# Patient Record
Sex: Male | Born: 1995 | Race: White | Hispanic: No | Marital: Single | Smoking: Never smoker
Health system: Southern US, Community
[De-identification: ages and names within clinical notes are randomized; demographics above are authoritative.]

## PROBLEM LIST (undated history)

## (undated) HISTORY — PX: TONSILLECTOMY: SUR1361

## (undated) HISTORY — PX: TONSILLECTOMY AND ADENOIDECTOMY: SUR1326

## (undated) HISTORY — PX: TYMPANOSTOMY TUBE PLACEMENT: SHX32

---

## 2019-12-22 ENCOUNTER — Encounter: Payer: Self-pay | Admitting: Emergency Medicine

## 2019-12-22 ENCOUNTER — Emergency Department (INDEPENDENT_AMBULATORY_CARE_PROVIDER_SITE_OTHER): Payer: 59

## 2019-12-22 ENCOUNTER — Other Ambulatory Visit: Payer: Self-pay

## 2019-12-22 ENCOUNTER — Emergency Department
Admission: EM | Admit: 2019-12-22 | Discharge: 2019-12-22 | Disposition: A | Discharge status: Home / Self Care | Payer: 59 | Source: Home / Self Care

## 2019-12-22 DIAGNOSIS — M545 Low back pain, unspecified: Secondary | ICD-10-CM

## 2019-12-22 DIAGNOSIS — Y9355 Activity, bike riding: Secondary | ICD-10-CM

## 2019-12-22 DIAGNOSIS — M4310 Spondylolisthesis, site unspecified: Secondary | ICD-10-CM

## 2019-12-22 MED ORDER — PREDNISONE 50 MG PO TABS: 50.0000 mg | ORAL_TABLET | Freq: Every day | ORAL | 0 refills | Status: AC

## 2019-12-22 MED ORDER — CYCLOBENZAPRINE HCL 5 MG PO TABS: 5.0000 mg | ORAL_TABLET | Freq: Two times a day (BID) | ORAL | 0 refills | Status: DC | PRN

## 2019-12-22 NOTE — ED Provider Notes (Signed)
[facility]    CSN: [id number] Arrival date & time: 12/22/19  1045      History   Chief Complaint Chief Complaint  Patient presents with  . Back Pain    low    HPI Nicholas Ware is a 24 y.o. male.   HPI  Nicholas Ware is a 24 y.o. male presenting to UC with c/o gradually worsen bilateral lower back pain, worse along his lower spine.  He woke up with mild pain yesterday but it worsened after going for a 3 hour bike ride.  Hx of similar pain 2-3 years ago after a bike ride. He was advised he had a "pinched nerve" and was given muscle relaxers, which did help. Pt denies radiation of pain or numbness in arms or legs.  He took 200mg  Advil last night and 400mg  Advil this morning around 9AM w/o relief.    History reviewed. No pertinent past medical history.  There are no problems to display for this patient.   Past Surgical History:  Procedure Laterality Date  . TONSILLECTOMY    . TONSILLECTOMY AND ADENOIDECTOMY    . TYMPANOSTOMY TUBE PLACEMENT  ilst       Home Medications    Prior to Admission medications   Medication Sig Start Date End Date Taking? Authorizing Provider  cyclobenzaprine (FLEXERIL) 5 MG tablet Take 1-2 tablets (5-10 mg total) by mouth 2 (two) times daily as needed for muscle spasms. 12/22/19   Noe Gens, PA-C  predniSONE (DELTASONE) 50 MG tablet Take 1 tablet (50 mg total) by mouth daily with breakfast for 5 days. 12/22/19 12/27/19  Noe Gens, PA-C    Family History Family History  Problem Relation Age of Onset  . Hypertension Mother   . Diabetes Father     Social History Social History   Tobacco Use  . Smoking status: Never Smoker  . Smokeless tobacco: Never Used  Substance Use Topics  . Alcohol use: Never  . Drug use: Never     Allergies   Cefprozil, Cephalosporins, Loracarbef, and Penicillins   Review of Systems Review of Systems  Genitourinary: Negative for dysuria, flank pain, frequency and hematuria.    Musculoskeletal: Positive for back pain and myalgias. Negative for arthralgias and gait problem.  Skin: Negative for color change and wound.  Neurological: Negative for weakness and numbness.     Physical Exam Triage Vital Signs ED Triage Vitals  Enc Vitals Group     BP 12/22/19 1107 122/87     Pulse Rate 12/22/19 1107 70     Resp 12/22/19 1107 15     Temp 12/22/19 1107 98.7 F (37.1 C)     Temp Source 12/22/19 1107 Oral     SpO2 12/22/19 1107 99 %     Weight 12/22/19 1110 230 lb (104.3 kg)     Height 12/22/19 1110 5\' 7"  (1.702 m)     Head Circumference --      Peak Flow --      Pain Score 12/22/19 1109 3     Pain Loc --      Pain Edu? --      Excl. in [place]? --    No data found.  Updated Vital Signs BP 122/87 (BP Location: Right Arm)   Pulse 70   Temp 98.7 F (37.1 C) (Oral)   Resp 15   Ht 5\' 7"  (1.702 m)   Wt 230 lb (104.3 kg)   SpO2 99%   BMI 36.02 kg/m  Visual Acuity Right Eye Distance:   Left Eye Distance:   Bilateral Distance:    Right Eye Near:   Left Eye Near:    Bilateral Near:     Physical Exam Vitals and nursing note reviewed.  Constitutional:      Appearance: Normal appearance. He is well-developed.  HENT:     Head: Normocephalic and atraumatic.  Cardiovascular:     Rate and Rhythm: Normal rate.  Pulmonary:     Effort: Pulmonary effort is normal.  Musculoskeletal:        General: Tenderness present. Normal range of motion.     Cervical back: Normal range of motion. Bony tenderness present. Normal range of motion.       Back:     Comments: Full ROM upper and lower extremities with 5/5 strength, increased pain with hip flexion and rotation.   Skin:    General: Skin is warm and dry.     Capillary Refill: Capillary refill takes less than 2 seconds.     Findings: No bruising or erythema.  Neurological:     Mental Status: He is alert and oriented to person, place, and time.  Psychiatric:        Behavior: Behavior normal.      UC  Treatments / Results  Labs (all labs ordered are listed, but only abnormal results are displayed) Labs Reviewed - No data to display  EKG   Radiology DG Lumbar Spine Complete  Result Date: 12/22/2019 CLINICAL DATA:  Low back pain. Pain after bike riding. EXAM: LUMBAR SPINE - COMPLETE 4+ VIEW COMPARISON:  None. FINDINGS: No sign of fracture. 5 mm anterolisthesis of L5 on S1 w with some degenerative changes in this area. Vertebral body heights and disc spaces are otherwise well maintained. IMPRESSION: 5 mm anterolisthesis of L5 on S1 with some associated degenerative changes. Electronically Signed   By: Donzetta Kohut M.D.   On: 12/22/2019 11:59    Procedures Procedures (including critical care time)  Medications Ordered in UC Medications - No data to display  Initial Impression / Assessment and Plan / UC Course  I have reviewed the triage vital signs and the nursing notes.  Pertinent labs & imaging results that were available during my care of the patient were reviewed by me and considered in my medical decision making (see chart for details).    No red flag symptoms  Discussed imaging with pt Will tx symptomatically for pain Encouraged f/u with Sports Medicine for further evaluation and treatment for recurrent low back pain. AVS provided.  Final Clinical Impressions(s) / UC Diagnoses   Final diagnoses:  Acute midline low back pain without sciatica  Anterolisthesis     Discharge Instructions      Flexeril (cyclobenzaprine) is a muscle relaxer and may cause drowsiness. Do not drink alcohol, drive, or operate heavy machinery while taking.  You have been prescribed 5 days of prednisone, an oral steroid to help with inflammation in your muscles and around your lower spine to help with pain.  Be sure to take with food.  You may take 500mg  acetaminophen every 4-6 hours or in combination with ibuprofen 400-600mg  every 6-8 hours as needed for pain and inflammation.  There is an  increased risk of stomach ulcers and bleeds when taking prednisone with NSAIDs, do not take more than advised on product packaging or prescription. Take medication with food.  Call to schedule a follow up with Sports Medicine or an Orthopedist for further evaluation and treatment of  recurrent low back pain.  Additional imaging and/or a referral to physical therapy may be beneficial.       ED Prescriptions    Medication Sig Dispense Auth. Provider   cyclobenzaprine (FLEXERIL) 5 MG tablet Take 1-2 tablets (5-10 mg total) by mouth 2 (two) times daily as needed for muscle spasms. 30 tablet Doroteo Glassman, Stokely Jeancharles O, PA-C   predniSONE (DELTASONE) 50 MG tablet Take 1 tablet (50 mg total) by mouth daily with breakfast for 5 days. 5 tablet Lurene Shadow, [place]     I have reviewed the PDMP during this encounter.   Lurene Shadow, PA-C 12/22/19 1210

## 2019-12-22 NOTE — Discharge Instructions (Signed)
  Flexeril (cyclobenzaprine) is a muscle relaxer and may cause drowsiness. Do not drink alcohol, drive, or operate heavy machinery while taking.  You have been prescribed 5 days of prednisone, an oral steroid to help with inflammation in your muscles and around your lower spine to help with pain.  Be sure to take with food.  You may take 500mg  acetaminophen every 4-6 hours or in combination with ibuprofen 400-600mg  every 6-8 hours as needed for pain and inflammation.  There is an increased risk of stomach ulcers and bleeds when taking prednisone with NSAIDs, do not take more than advised on product packaging or prescription. Take medication with food.  Call to schedule a follow up with Sports Medicine or an Orthopedist for further evaluation and treatment of recurrent low back pain.  Additional imaging and/or a referral to physical therapy may be beneficial.

## 2019-12-22 NOTE — ED Triage Notes (Signed)
Low back pain since yesterday - increased pain after going for a bike ride for 3 hours Sam pain in past "was told he had a pinched nerve and was given muscle relaxers" Pain increases w/ movement OTC advill 200 mg last night & advil 400mg  at 0900 min relief

## 2019-12-25 ENCOUNTER — Ambulatory Visit (INDEPENDENT_AMBULATORY_CARE_PROVIDER_SITE_OTHER): Payer: 59 | Admitting: Sports Medicine

## 2019-12-25 ENCOUNTER — Encounter: Payer: Self-pay | Admitting: Sports Medicine

## 2019-12-25 DIAGNOSIS — M4317 Spondylolisthesis, lumbosacral region: Secondary | ICD-10-CM | POA: Diagnosis not present

## 2019-12-25 NOTE — Progress Notes (Signed)
    Procedures performed today:    None.  Independent interpretation of notes and tests performed by another provider:   I did personally review his lumbar spine x-rays which showed very mild degenerative disc disease at the L5-S1 level as well as mild grade 1 spondylolisthesis at the same level, there is potentially a pars interarticularis defect at the L5-S1 level as well.  Brief History, Exam, Impression, and Recommendations:    Spondylolisthesis at L5-S1 level This is a very pleasant 24 year old male, he went on a long bike ride, subsequently had severe back pain, was seen in urgent care x-rays showed mild DDD at the L5-S1 level as well as grade 1 spondylolisthesis. He was treated appropriately with prednisone, Flexeril, and has had good improvement after just 2 to 3 days. We discussed the evolutionary anthropology of low back pain as well as the treatment plan, he will finish the prednisone, and then can do Motrin or Tylenol as desired. Adding aggressive formal physical therapy, return to see me in 6 weeks, MRI for interventional planning if no better. No red flag symptoms.    ___________________________________________ Ihor Austin. Benjamin Stain, M.D., ABFM., CAQSM. Primary Care and Sports Medicine [facility]  Adjunct Instructor of Family Medicine  [employer]

## 2019-12-25 NOTE — Assessment & Plan Note (Signed)
This is a very pleasant 24 year old male, he went on a long bike ride, subsequently had severe back pain, was seen in urgent care x-rays showed mild DDD at the L5-S1 level as well as grade 1 spondylolisthesis. He was treated appropriately with prednisone, Flexeril, and has had good improvement after just 2 to 3 days. We discussed the evolutionary anthropology of low back pain as well as the treatment plan, he will finish the prednisone, and then can do Motrin or Tylenol as desired. Adding aggressive formal physical therapy, return to see me in 6 weeks, MRI for interventional planning if no better. No red flag symptoms.

## 2019-12-26 ENCOUNTER — Ambulatory Visit (INDEPENDENT_AMBULATORY_CARE_PROVIDER_SITE_OTHER): Payer: 59 | Admitting: Physical Therapy

## 2019-12-26 ENCOUNTER — Encounter: Payer: Self-pay | Admitting: Physical Therapy

## 2019-12-26 ENCOUNTER — Other Ambulatory Visit: Payer: Self-pay

## 2019-12-26 DIAGNOSIS — M6281 Muscle weakness (generalized): Secondary | ICD-10-CM | POA: Diagnosis not present

## 2019-12-26 DIAGNOSIS — M545 Low back pain, unspecified: Secondary | ICD-10-CM

## 2019-12-26 NOTE — Therapy (Signed)
[facility] [place] [address] [place], [place], [address] Phone: [phone]   Fax:  [phone]  Physical Therapy Evaluation  Patient Details  Name: Nicholas Ware MRN: [id number] Date of Birth: 12/31/1995 Referring Provider (PT): Monica Becton, MD   Encounter Date: 12/26/2019  PT End of Session - 12/26/19 1101    Visit Number  1    Number of Visits  12    Date for PT Re-Evaluation  02/06/20    PT Start Time  1105    PT Stop Time  1155    PT Time Calculation (min)  50 min    Activity Tolerance  Patient tolerated treatment well    Behavior During Therapy  [facility] for tasks assessed/performed       History reviewed. No pertinent past medical history.  Past Surgical History:  Procedure Laterality Date  . TONSILLECTOMY    . TONSILLECTOMY AND ADENOIDECTOMY    . TYMPANOSTOMY TUBE PLACEMENT  ilst    There were no vitals filed for this visit.   Subjective Assessment - 12/26/19 1105    Subjective  Pt reports that on Saturday afternoon he had a 2 hour long bike ride and had immediate low back pain thereafter. On Sunday his low back still hurt despite pain medication and went to see urgent care. Pt notes that It is currently not anywhere close to how it felt on Sunday. He reports increased pain with twisting and while sitting still in the straight back chair there is very dull pain. He states pain is 3 or 4 out of 10 at rest. 6 to 7 with activity. He states he has been sleeping in recliner    Limitations  Other (comment);Walking    Currently in Pain?  Yes    Pain Score  6     Pain Location  Back    Pain Orientation  Lower;Right;Left    Pain Descriptors / Indicators  Dull;Sore    Pain Type  Acute pain    Pain Radiating Towards  Reports radiation as "wings"    Pain Onset  In the past 7 days    Pain Frequency  Intermittent    Aggravating Factors   Twisting and sidelying         [facility] PT Assessment - 12/26/19 0001      Assessment   Medical Diagnosis  M43.17 (ICD-10-CM) - Spondylolisthesis at L5-S1 level    Referring Provider (PT)  Monica Becton, MD    Onset Date/Surgical Date  12/21/19      Precautions   Precautions  --      Balance Screen   Has the patient fallen in the past 6 months  No    Has the patient had a decrease in activity level because of a fear of falling?   No    Is the patient reluctant to leave their home because of a fear of falling?   No      Home Environment   Living Environment  Private residence    Living Arrangements  Alone    Type of Home  Apartment      Prior Function   Level of Independence  Independent    Vocation  Full time employment   Engineer, mostly at desk   Leisure  Rides bike      Squat   Comments  Increased knee flexion past toes; pain with end range squat, heels off ground      Posture/Postural Control  Posture Comments  Increased lumbar lordosis, flat thoracic      AROM   Lumbar Flexion  Limited 80%    Lumbar Extension  Limited ~25%    Lumbar - Right Side Bend  WFL    Lumbar - Left Side Bend  WFL     Lumbar - Right Rotation  [facility] but painful    Lumbar - Left Rotation  WFL but painful    Thoracic - Right Rotation  ~50% limited    Thoracic - Left Rotation  [facility]      Strength   Right Hip Extension  4/5   limited due to pain   Right Hip ABduction  4/5    Right Hip ADduction  --   limited due to pain   Left Hip Extension  4/5   limited due to pain   Left Hip ABduction  4/5   limited due to pain     Flexibility   Hamstrings  45 degrees bilaterally    Quadriceps  45 degrees bilaterally Ely's    Piriformis  Attempted with figure 4, limited due to back pain      Palpation   Spinal mobility  Tenderness and hypomobility of thoracolumbar    Palpation comment  Quadratus lumborum mildly tender, psoas tender      FABER test   findings  Negative      Prone Knee Bend Test   Findings  Positive      Straight Leg Raise   Findings  Positive     Comment  Increased R vs L pain                  Objective measurements completed on examination: See above findings.      [place] Adult PT Treatment/Exercise - 12/26/19 0001      Lumbar Exercises: Stretches   Active Hamstring Stretch  3 reps;30 seconds      Lumbar Exercises: Supine   Pelvic Tilt  5 reps;5 seconds    Basic Lumbar Stabilization  5 reps;5 seconds   TSA contraction with pelvic tilt            PT Education - 12/26/19 1213    Education Details  Discussed core activation and strengthening; discussed stretching as needed and improving posture for tasks. Discussed attempting to sleep in bed with modifications. Discussed spine care/protection    Person(s) Educated  Patient    Methods  Explanation;Demonstration;Handout;Verbal cues;Tactile cues    Comprehension  Verbalized understanding;Returned demonstration;Verbal cues required       PT Short Term Goals - 12/26/19 1228      PT SHORT TERM GOAL #1   Title  Pt will be independent with HEP    Time  3    Period  Weeks    Status  New    Target Date  01/16/20        PT Long Term Goals - 12/26/19 1228      PT LONG TERM GOAL #1   Title  Pt will demonstrate improved thoracolumbar mobility in flexion and extension for daily tasks    Time  6    Period  Weeks    Status  New    Target Date  02/06/20      PT LONG TERM GOAL #2   Title  Pt will have 5/5 bilateral hip strength for improved pain    Baseline  4/5 in abduction and extension; limited due to pain    Time  6  Period  Weeks    Status  New    Target Date  02/06/20      PT LONG TERM GOAL #3   Title  Pt will be able to tolerate riding bike >1 hour without additional back pain    Baseline  Unable    Time  6    Period  Weeks    Status  New    Target Date  02/06/20      PT LONG TERM GOAL #4   Title  Pt will report decrease in back pain to <2/10    Baseline  6/10 at worst    Time  6    Period  Weeks    Status  New    Target Date   02/06/20      PT LONG TERM GOAL #5   Title  Pt will have reduced FOTO score to 25% impairment    Baseline  FOTO is 53%    Time  6    Period  Weeks    Status  New    Target Date  02/06/20             Plan - 12/26/19 1216    Clinical Impression Statement  Pt is a 24 year old M presenting to [facility] with c/o bilateral back pain exacerbated since long bike ride 5 days ago (12/21/19). Pt presents with pain, increased lumbar lordosis, bilateral hip and core weakness, and thoracolumbar hypomobility affecting pt's ability to sleep in bed, daily tasks at home and at work, and limiting biking activity. Pt would benefit from therapy to optimize his level of function. Treatment focused primarily on stretching, activation of transversus abdominus and spine care education.    Examination-Activity Limitations  Bend;Stairs;Sleep;Stand    Examination-Participation Restrictions  Community Activity;Other    Clinical Decision Making  Low    Rehab Potential  Good    PT Frequency  2x / week    PT Duration  6 weeks    PT Treatment/Interventions  Electrical Stimulation;Cryotherapy;Iontophoresis 4mg /ml Dexamethasone;Moist Heat;Ultrasound;Stair training;Functional mobility training;Therapeutic activities;Therapeutic exercise;Neuromuscular re-education;Manual techniques;Patient/family education;ADLs/Self Care Home Management;Passive range of motion;Dry needling;Taping;Spinal Manipulations;Joint Manipulations    PT Next Visit Plan  Review HEP. Continue and progress core strengthening. Add hip strengthening. Reinforce posture. Perform stretching, STM and joint mobilizations if indicate. Consider heat and e-stim. May benefit from TENs unit at home.    PT Home Exercise Plan  [email or web address]    Consulted and Agree with Plan of Care  Patient       Patient will benefit from skilled therapeutic intervention in order to improve the following deficits and impairments:  Decreased range of motion, Increased fascial restricitons,  Decreased activity tolerance, Hypermobility, Pain, Hypomobility, Impaired flexibility, Improper body mechanics, Decreased strength, Postural dysfunction  Visit Diagnosis: Acute bilateral low back pain without sciatica  Muscle weakness (generalized)     Problem List Patient Active Problem List   Diagnosis Date Noted  . Spondylolisthesis at L5-S1 level 12/25/2019    [facility] April Ma L Shaelin Lalley PT, DPT 12/26/2019, 12:44 PM  [facility] [address] [place], [place], [place] Phone: [phone]   Fax:  [phone]  Name: Nicholas Ware MRN: Dolores Hoose Date of Birth: 02-19-1996

## 2019-12-26 NOTE — Patient Instructions (Signed)
Access Code: [email or web address] URL: [email or web address] Date: 12/26/2019 Prepared by: Vernon Prey April Kirstie Peri  Exercises Supine Hamstring Stretch with Strap - 1 x daily - 7 x weekly - 10 reps - 3 sets Supine Posterior Pelvic Tilt - 1 x daily - 7 x weekly - 10 reps - 3 sets Supine Transversus Abdominis Bracing - Hands on Stomach - 1 x daily - 7 x weekly - 10 reps - 3 sets  Patient Education Hospital doctor

## 2020-01-01 ENCOUNTER — Encounter: Payer: Self-pay | Admitting: Physical Therapy

## 2020-01-01 ENCOUNTER — Other Ambulatory Visit: Payer: Self-pay

## 2020-01-01 ENCOUNTER — Ambulatory Visit (INDEPENDENT_AMBULATORY_CARE_PROVIDER_SITE_OTHER): Payer: 59 | Admitting: Physical Therapy

## 2020-01-01 DIAGNOSIS — M545 Low back pain, unspecified: Secondary | ICD-10-CM

## 2020-01-01 DIAGNOSIS — M6281 Muscle weakness (generalized): Secondary | ICD-10-CM | POA: Diagnosis not present

## 2020-01-01 NOTE — Patient Instructions (Signed)

## 2020-01-01 NOTE — Therapy (Signed)
[facility] [place] [address] [place], [place], [address] Phone: [phone]   Fax:  [phone]  Physical Therapy Treatment  Patient Details  Name: Nicholas Ware MRN: [id number] Date of Birth: 12/24/1995 Referring Provider (PT): Monica Becton, MD   Encounter Date: 01/01/2020  PT End of Session - 01/01/20 1105    Visit Number  2    Number of Visits  12    Date for PT Re-Evaluation  02/06/20    PT Start Time  1105    PT Stop Time  1154    PT Time Calculation (min)  49 min    Activity Tolerance  Patient tolerated treatment well    Behavior During Therapy  [facility] for tasks assessed/performed       History reviewed. No pertinent past medical history.  Past Surgical History:  Procedure Laterality Date  . TONSILLECTOMY    . TONSILLECTOMY AND ADENOIDECTOMY    . TYMPANOSTOMY TUBE PLACEMENT  ilst    There were no vitals filed for this visit.  Subjective Assessment - 01/01/20 1109    Subjective  Pt reports he is no longer sleeping in recliner; sleeping with pillow under abdomen in prone. Has been performing HEP daily.    Currently in Pain?  Yes    Pain Score  2    up to 4/10   Pain Location  Back    Pain Orientation  Right;Left;Lower    Pain Descriptors / Indicators  Sore;Dull    Aggravating Factors   up and moving, twisting    Pain Relieving Factors  sitting still         [facility] PT Assessment - 01/01/20 0001      Assessment   Medical Diagnosis  M43.17 (ICD-10-CM) - Spondylolisthesis at L5-S1 level    Referring Provider (PT)  Monica Becton, MD    Onset Date/Surgical Date  12/21/19    Next MD Visit  02/05/20       [facility] Adult PT Treatment/Exercise - 01/01/20 0001      Self-Care   Self-Care  Other Self-Care Comments    Other Self-Care Comments   Pt educated re: self massage with roller stick to LE and ball to back and hip musculature. Pt returned demo with cues.       Lumbar Exercises: Stretches   Passive Hamstring Stretch  Left;Right;3 reps;20 seconds    Passive Hamstring Stretch Limitations  supine with strap, then seated x 1 rep    Hip Flexor Stretch  Right;Left;1 rep;20 seconds    Hip Flexor Stretch Limitations  trial of arm overhead     Piriformis Stretch  Right;Left;20 seconds;1 rep   seated      Lumbar Exercises: Aerobic   Recumbent Bike  L7 (pt preference): 4.5 min for warm up.       Lumbar Exercises: Supine   Ab Set  5 reps;5 seconds    Clam  5 reps;5 seconds    Bent Knee Raise  5 reps    Bent Knee Raise Limitations  Rt hip cramped after 4 reps    Bridge  10 reps      Modalities   Modalities  Moist Heat;Electrical Stimulation      Moist Heat Therapy   Number Minutes Moist Heat  10 Minutes    Moist Heat Location  Lumbar Spine      Electrical Stimulation   Electrical Stimulation Location  bilat lumbar     Electrical Stimulation Action  IFC  Electrical Stimulation Parameters  intensity to tolerance x 10     Electrical Stimulation Goals  Pain;Tone             PT Education - 01/01/20 2048    Education Details  TENS info, HEP- added hip flexor stretch    Person(s) Educated  Patient    Methods  Explanation    Comprehension  Verbalized understanding;Returned demonstration       PT Short Term Goals - 12/26/19 1228      PT SHORT TERM GOAL #1   Title  Pt will be independent with HEP    Time  3    Period  Weeks    Status  New    Target Date  01/16/20        PT Long Term Goals - 12/26/19 1228      PT LONG TERM GOAL #1   Title  Pt will demonstrate improved thoracolumbar mobility in flexion and extension for daily tasks    Time  6    Period  Weeks    Status  New    Target Date  02/06/20      PT LONG TERM GOAL #2   Title  Pt will have 5/5 bilateral hip strength for improved pain    Baseline  4/5 in abduction and extension; limited due to pain    Time  6    Period  Weeks    Status  New    Target Date  02/06/20      PT LONG TERM GOAL #3    Title  Pt will be able to tolerate riding bike >1 hour without additional back pain    Baseline  Unable    Time  6    Period  Weeks    Status  New    Target Date  02/06/20      PT LONG TERM GOAL #4   Title  Pt will report decrease in back pain to <2/10    Baseline  6/10 at worst    Time  6    Period  Weeks    Status  New    Target Date  02/06/20      PT LONG TERM GOAL #5   Title  Pt will have reduced FOTO score to 25% impairment    Baseline  FOTO is 53%    Time  6    Period  Weeks    Status  New    Target Date  02/06/20            Plan - 01/01/20 2043    Clinical Impression Statement  Pt reporting overall pain reduction in intensity and frequency. Pt able to engage TA with minimal cues.  Pt tolerated all exercises well without increase in pain.  Pt reported reduction of pain and improvement in lumbar ROM after estim/MHP at end of session.    Examination-Activity Limitations  Bend;Stairs;Sleep;Stand    Examination-Participation Restrictions  Community Activity;Other    Rehab Potential  Good    PT Frequency  2x / week    PT Duration  6 weeks    PT Treatment/Interventions  Electrical Stimulation;Cryotherapy;Iontophoresis 4mg /ml Dexamethasone;Moist Heat;Ultrasound;Stair training;Functional mobility training;Therapeutic activities;Therapeutic exercise;Neuromuscular re-education;Manual techniques;Patient/family education;ADLs/Self Care Home Management;Passive range of motion;Dry needling;Taping;Spinal Manipulations;Joint Manipulations    PT Next Visit Plan  Continue and progress core strengthening.    PT Home Exercise Plan  [id number]    Consulted and Agree with Plan of Care  Patient  Patient will benefit from skilled therapeutic intervention in order to improve the following deficits and impairments:  Decreased range of motion, Increased fascial restricitons, Decreased activity tolerance, Hypermobility, Pain, Hypomobility, Impaired flexibility, Improper body mechanics,  Decreased strength, Postural dysfunction  Visit Diagnosis: Acute bilateral low back pain without sciatica  Muscle weakness (generalized)     Problem List Patient Active Problem List   Diagnosis Date Noted  . Spondylolisthesis at L5-S1 level 12/25/2019   Mayer Camel, PTA 01/01/20 8:50 PM  [facility] [address] [place], [place], [address] Phone: [phone]   Fax:  [phone]  Name: Nicholas Ware MRN: [id number] Date of Birth: [birth year]

## 2020-01-03 ENCOUNTER — Other Ambulatory Visit: Payer: Self-pay

## 2020-01-03 ENCOUNTER — Ambulatory Visit (INDEPENDENT_AMBULATORY_CARE_PROVIDER_SITE_OTHER): Payer: 59 | Admitting: Physical Therapy

## 2020-01-03 ENCOUNTER — Encounter: Payer: Self-pay | Admitting: Family Medicine

## 2020-01-03 ENCOUNTER — Ambulatory Visit (INDEPENDENT_AMBULATORY_CARE_PROVIDER_SITE_OTHER): Payer: 59 | Admitting: Family Medicine

## 2020-01-03 ENCOUNTER — Encounter: Payer: Self-pay | Admitting: Physical Therapy

## 2020-01-03 VITALS — BP 131/76 | HR 61 | Wt 232.0 lb

## 2020-01-03 DIAGNOSIS — Z0000 Encounter for general adult medical examination without abnormal findings: Secondary | ICD-10-CM | POA: Diagnosis not present

## 2020-01-03 DIAGNOSIS — M545 Low back pain, unspecified: Secondary | ICD-10-CM

## 2020-01-03 DIAGNOSIS — E6609 Other obesity due to excess calories: Secondary | ICD-10-CM

## 2020-01-03 DIAGNOSIS — Z13220 Encounter for screening for lipoid disorders: Secondary | ICD-10-CM | POA: Diagnosis not present

## 2020-01-03 DIAGNOSIS — R21 Rash and other nonspecific skin eruption: Secondary | ICD-10-CM | POA: Diagnosis not present

## 2020-01-03 DIAGNOSIS — M6281 Muscle weakness (generalized): Secondary | ICD-10-CM

## 2020-01-03 NOTE — Patient Instructions (Addendum)
Great to meet you today! Please have labs completed.  Schedule an annual exam after you have these completed and we'll review these at that visit.  Try clotrimazole cream to back of neck area.

## 2020-01-03 NOTE — Therapy (Signed)
[place], [place], [address] Phone: [phone]   Fax:  [phone]  Physical Therapy Treatment  Patient Details  Name: Nicholas Ware MRN: [id number] Date of Birth: 1996-07-08 Referring Provider (PT): Silverio Decamp, MD   Encounter Date: 01/03/2020  PT End of Session - 01/03/20 1014    Visit Number  3    Number of Visits  12    Date for PT Re-Evaluation  02/06/20    PT Start Time  0932    PT Stop Time  1056    PT Time Calculation (min)  41 min    Activity Tolerance  Patient tolerated treatment well    Behavior During Therapy  [facility] for tasks assessed/performed       History reviewed. No pertinent past medical history.  Past Surgical History:  Procedure Laterality Date  . TONSILLECTOMY    . TONSILLECTOMY AND ADENOIDECTOMY    . TYMPANOSTOMY TUBE PLACEMENT  ilst    There were no vitals filed for this visit.  Subjective Assessment - 01/03/20 1017    Subjective  Pt states he has had no pain. Pt reports some soreness initially in the morning but then improved throughout the day. Localized area of pain noted in low back, no more radiation    Limitations  Other (comment);Walking    Currently in Pain?  Yes    Pain Score  2     Pain Location  Back    Pain Orientation  Right;Left;Lower    Pain Descriptors / Indicators  Sore;Dull    Pain Type  Acute pain    Pain Onset  In the past 7 days                        [facility] Adult PT Treatment/Exercise - 01/03/20 0001      Lumbar Exercises: Supine   Clam  10 reps    Bridge  10 reps;2 seconds      Lumbar Exercises: Prone   Opposite Arm/Leg Raise  10 reps;Right arm/Left leg;Left arm/Right leg    Opposite Arm/Leg Raise Limitations  To fatigue      Knee/Hip Exercises: Stretches   Active Hamstring Stretch  Both;30 seconds;2 reps    Gastroc Stretch  30 seconds;Both    Soleus Stretch  30 seconds;Both      Modalities   Modalities   Moist Heat;Electrical Stimulation      Moist Heat Therapy   Number Minutes Moist Heat  10 Minutes    Moist Heat Location  Lumbar Spine      Electrical Stimulation   Electrical Stimulation Location  bilat lumbar     Electrical Stimulation Action  IFC    Electrical Stimulation Parameters  intensity to tolerance    Electrical Stimulation Goals  Pain;Tone             PT Education - 01/03/20 1054    Education Details  Discussed progressing his core and back stabilization exercises. Addition of stretches.    Person(s) Educated  Patient    Methods  Explanation    Comprehension  Verbalized understanding;Returned demonstration       PT Short Term Goals - 12/26/19 1228      PT SHORT TERM GOAL #1   Title  Pt will be independent with HEP    Time  3    Period  Weeks    Status  New    Target Date  01/16/20  PT Long Term Goals - 12/26/19 1228      PT LONG TERM GOAL #1   Title  Pt will demonstrate improved thoracolumbar mobility in flexion and extension for daily tasks    Time  6    Period  Weeks    Status  New    Target Date  02/06/20      PT LONG TERM GOAL #2   Title  Pt will have 5/5 bilateral hip strength for improved pain    Baseline  4/5 in abduction and extension; limited due to pain    Time  6    Period  Weeks    Status  New    Target Date  02/06/20      PT LONG TERM GOAL #3   Title  Pt will be able to tolerate riding bike >1 hour without additional back pain    Baseline  Unable    Time  6    Period  Weeks    Status  New    Target Date  02/06/20      PT LONG TERM GOAL #4   Title  Pt will report decrease in back pain to <2/10    Baseline  6/10 at worst    Time  6    Period  Weeks    Status  New    Target Date  02/06/20      PT LONG TERM GOAL #5   Title  Pt will have reduced FOTO score to 25% impairment    Baseline  FOTO is 53%    Time  6    Period  Weeks    Status  New    Target Date  02/06/20            Plan - 01/03/20 1055     Clinical Impression Statement  Pt reports satisfaction with his progress and overall pain reduction. Pt able to engage TA independently. Treatment focused on increasing core and lumbar stabilization and stretching. Good response to e-stim for pain modulation -- no pain by end of session.    Examination-Activity Limitations  Bend;Stairs;Sleep;Stand    Examination-Participation Restrictions  Community Activity;Other    Rehab Potential  Good    PT Frequency  2x / week    PT Duration  6 weeks    PT Treatment/Interventions  Electrical Stimulation;Cryotherapy;Iontophoresis 4mg /ml Dexamethasone;Moist Heat;Ultrasound;Stair training;Functional mobility training;Therapeutic activities;Therapeutic exercise;Neuromuscular re-education;Manual techniques;Patient/family education;ADLs/Self Care Home Management;Passive range of motion;Dry needling;Taping;Spinal Manipulations;Joint Manipulations    PT Next Visit Plan  Continue and progress core and back strengthening.    PT Home Exercise Plan  [id number]       Patient will benefit from skilled therapeutic intervention in order to improve the following deficits and impairments:  Decreased range of motion, Increased fascial restricitons, Decreased activity tolerance, Hypermobility, Pain, Hypomobility, Impaired flexibility, Improper body mechanics, Decreased strength, Postural dysfunction  Visit Diagnosis: Acute bilateral low back pain without sciatica  Muscle weakness (generalized)     Problem List Patient Active Problem List   Diagnosis Date Noted  . Spondylolisthesis at L5-S1 level 12/25/2019    [facility] April Ma L Stewardson PT, DPT 01/03/2020, 12:27 PM  [facility] [address] [place], [place], [place] Phone: [phone]   Fax:  [phone]  Name: Nicholas Ware MRN: Dolores Hoose Date of Birth: [birth year]

## 2020-01-06 ENCOUNTER — Encounter: Payer: Self-pay | Admitting: Family Medicine

## 2020-01-06 DIAGNOSIS — R21 Rash and other nonspecific skin eruption: Secondary | ICD-10-CM | POA: Insufficient documentation

## 2020-01-06 NOTE — Progress Notes (Signed)
Nicholas Ware - 24 y.o. male MRN [id number]  Date of birth: [birth year]  Subjective Chief Complaint  Patient presents with  . Establish Care    HPI Nicholas Ware is a 24 y.o. male here today for initial visit.  He has been fairly healthy and denies any long term medical problems.   He has been seeing Dr. Darene Lamer for management of low back pain and currently doing PT.  He has found this be helpful.  He would like to know his blood type.  He also has some patches on the back of his neck that are discolored.  He denies itching or pain associated with this.  He plans to return for annual exam in a few weeks and have labs completed prior to appt.   ROS:  A comprehensive ROS was completed and negative except as noted per HPI  Allergies  Allergen Reactions  . Cefprozil Hives  . Cephalosporins Hives  . Loracarbef Hives  . Penicillins Hives    History reviewed. No pertinent past medical history.  Past Surgical History:  Procedure Laterality Date  . TONSILLECTOMY    . TONSILLECTOMY AND ADENOIDECTOMY    . TYMPANOSTOMY TUBE PLACEMENT  ilst    Social History   Socioeconomic History  . Marital status: Single    Spouse name: Not on file  . Number of children: Not on file  . Years of education: Not on file  . Highest education level: Not on file  Occupational History  . Not on file  Tobacco Use  . Smoking status: Never Smoker  . Smokeless tobacco: Never Used  Substance and Sexual Activity  . Alcohol use: Never  . Drug use: Never  . Sexual activity: Not on file  Other Topics Concern  . Not on file  Social History Narrative  . Not on file   Social Determinants of Health   Financial Resource Strain:   . Difficulty of Paying Living Expenses:   Food Insecurity:   . Worried About Charity fundraiser in the Last Year:   . Arboriculturist in the Last Year:   Transportation Needs:   . Film/video editor (Medical):   Marland Kitchen Lack of Transportation (Non-Medical):   Physical Activity:   . Days of  Exercise per Week:   . Minutes of Exercise per Session:   Stress:   . Feeling of Stress :   Social Connections:   . Frequency of Communication with Friends and Family:   . Frequency of Social Gatherings with Friends and Family:   . Attends Religious Services:   . Active Member of Clubs or Organizations:   . Attends Archivist Meetings:   Marland Kitchen Marital Status:     Family History  Problem Relation Age of Onset  . Hypertension Mother   . Diabetes Father     Health Maintenance  Topic Date Due  . HIV Screening  Never done  . INFLUENZA VACCINE  03/08/2020  . TETANUS/TDAP  01/22/2025  . COVID-19 Vaccine  Completed     ----------------------------------------------------------------------------------------------------------------------------------------------------------------------------------------------------------------- Physical Exam BP 131/76   Pulse 61   Wt 232 lb (105.2 kg)   SpO2 97%   BMI 36.34 kg/m   Physical Exam Constitutional:      Appearance: Normal appearance.  Cardiovascular:     Rate and Rhythm: Normal rate and regular rhythm.  Pulmonary:     Effort: Pulmonary effort is normal.     Breath sounds: Normal breath sounds.  Musculoskeletal:  Cervical back: Neck supple.  Skin:    Comments: A few annular appearing, hyperpigmented patches on back of neck.    Neurological:     Mental Status: He is alert.     ------------------------------------------------------------------------------------------------------------------------------------------------------------------------------------------------------------------- Assessment and Plan  Rash Appearance appears fungal in nature.  Will have him try clotrimazole to area.  Will reassess once he returns for annual exam.   Recommend blood donation or commercially available antibody kit on [employer] to check blood type.   Labs ordered to have drawn prior to upcoming annual exam.    No orders of the  defined types were placed in this encounter.   No follow-ups on file.    This visit occurred during the SARS-CoV-2 public health emergency.  Safety protocols were in place, including screening questions prior to the visit, additional usage of staff PPE, and extensive cleaning of exam room while observing appropriate contact time as indicated for disinfecting solutions.

## 2020-01-06 NOTE — Assessment & Plan Note (Signed)
Appearance appears fungal in nature.  Will have him try clotrimazole to area.  Will reassess once he returns for annual exam.

## 2020-01-08 ENCOUNTER — Other Ambulatory Visit: Payer: Self-pay

## 2020-01-08 ENCOUNTER — Ambulatory Visit (INDEPENDENT_AMBULATORY_CARE_PROVIDER_SITE_OTHER): Payer: 59 | Admitting: Physical Therapy

## 2020-01-08 ENCOUNTER — Encounter: Payer: Self-pay | Admitting: Physical Therapy

## 2020-01-08 DIAGNOSIS — M545 Low back pain, unspecified: Secondary | ICD-10-CM

## 2020-01-08 DIAGNOSIS — M6281 Muscle weakness (generalized): Secondary | ICD-10-CM

## 2020-01-08 NOTE — Therapy (Signed)
[facility] [place] [address] [place], [place], [address] Phone: [phone]   Fax:  [phone]  Physical Therapy Treatment  Patient Details  Name: Nicholas Ware MRN: [id number] Date of Birth: 06/02/1996 Referring Provider (PT): Monica Becton, MD   Encounter Date: 01/08/2020  PT End of Session - 01/08/20 1059    Visit Number  4    Number of Visits  12    Date for PT Re-Evaluation  02/06/20    PT Start Time  1100    PT Stop Time  1146    PT Time Calculation (min)  46 min    Activity Tolerance  Patient tolerated treatment well    Behavior During Therapy  [facility] for tasks assessed/performed       History reviewed. No pertinent past medical history.  Past Surgical History:  Procedure Laterality Date  . TONSILLECTOMY    . TONSILLECTOMY AND ADENOIDECTOMY    . TYMPANOSTOMY TUBE PLACEMENT  ilst    There were no vitals filed for this visit.  Subjective Assessment - 01/08/20 1100    Subjective  Pt states he has been doing fine and able to visit friends this past weekend. Pt notes that when he wakes up in the morning his back still feels stiff but doing the stretches helps ease it off. Pt reports his rest in general has not been great. Pt reports he was able to go on a short bike ride and felt his back stiffen up -- he performed the stretches and it eased off. Pt states he has found his back is weaker than his normal (i.e. he was able to do pull-ups but is currently not able to do one) and wishes to return to his PLOF    Limitations  Other (comment);Walking    Currently in Pain?  No/denies                        [facility] Adult PT Treatment/Exercise - 01/08/20 0001      Lumbar Exercises: Stretches   Active Hamstring Stretch  Right;Left;10 seconds    Press Ups  1 rep;20 seconds      Lumbar Exercises: Aerobic   Stationary Bike  L5 x 5 min      Lumbar Exercises: Standing   Other Standing Lumbar Exercises  Wall  squats x 10 reps      Lumbar Exercises: Supine   Bridge  5 reps   Reports feeling more in shoulder than glutes and hamstrings     Lumbar Exercises: Sidelying   Hip Abduction  Both;10 reps   R fatigued faster than L LE     Lumbar Exercises: Prone   Straight Leg Raise  10 reps    Opposite Arm/Leg Raise  10 reps;Right arm/Left leg;Left arm/Right leg      Lumbar Exercises: Quadruped   Single Arm Raise  10 reps;Right;Left    Other Quadruped Lumbar Exercises  TSA contractions x 10 reps      Ware/Hip Exercises: Stretches   Piriformis Stretch  2 reps;30 seconds      Moist Heat Therapy   Number Minutes Moist Heat  10 Minutes    Moist Heat Location  Lumbar Spine      Electrical Stimulation   Electrical Stimulation Location  bilat lumbar     Electrical Stimulation Action  IFC    Electrical Stimulation Parameters  to tolerance    Electrical Stimulation Goals  Pain;Tone  PT Education - 01/08/20 1148    Education Details  Discussed HEP modifications and including core bracing with functional movements.    Person(s) Educated  Patient    Methods  Explanation;Handout    Comprehension  Verbalized understanding;Verbal cues required;Tactile cues required       PT Short Term Goals - 12/26/19 1228      PT SHORT TERM GOAL #1   Title  Pt will be independent with HEP    Time  3    Period  Weeks    Status  New    Target Date  01/16/20        PT Long Term Goals - 12/26/19 1228      PT LONG TERM GOAL #1   Title  Pt will demonstrate improved thoracolumbar mobility in flexion and extension for daily tasks    Time  6    Period  Weeks    Status  New    Target Date  02/06/20      PT LONG TERM GOAL #2   Title  Pt will have 5/5 bilateral hip strength for improved pain    Baseline  4/5 in abduction and extension; limited due to pain    Time  6    Period  Weeks    Status  New    Target Date  02/06/20      PT LONG TERM GOAL #3   Title  Pt will be able to tolerate  riding bike >1 hour without additional back pain    Baseline  Unable    Time  6    Period  Weeks    Status  New    Target Date  02/06/20      PT LONG TERM GOAL #4   Title  Pt will report decrease in back pain to <2/10    Baseline  6/10 at worst    Time  6    Period  Weeks    Status  New    Target Date  02/06/20      PT LONG TERM GOAL #5   Title  Pt will have reduced FOTO score to 25% impairment    Baseline  FOTO is 53%    Time  6    Period  Weeks    Status  New    Target Date  02/06/20            Plan - 01/08/20 1148    Clinical Impression Statement  Pt able to engage TA independently, difficulty engaging with multiple joint movements. Pt mostly plagued by feelings of stiffness. Treatment focused on increasing core, hip, and lumbar strengthening. Continues to respond well to e-stim.    Examination-Activity Limitations  Bend;Stairs;Sleep;Stand    Examination-Participation Restrictions  Community Activity;Other    Rehab Potential  Good    PT Frequency  2x / week    PT Duration  6 weeks    PT Treatment/Interventions  Electrical Stimulation;Cryotherapy;Iontophoresis 4mg /ml Dexamethasone;Moist Heat;Ultrasound;Stair training;Functional mobility training;Therapeutic activities;Therapeutic exercise;Neuromuscular re-education;Manual techniques;Patient/family education;ADLs/Self Care Home Management;Passive range of motion;Dry needling;Taping;Spinal Manipulations;Joint Manipulations    PT Next Visit Plan  Continue and progress core and back strengthening. Consider use of more manual therapy if indicated.    PT Home Exercise Plan  [id number]       Patient will benefit from skilled therapeutic intervention in order to improve the following deficits and impairments:  Decreased range of motion, Increased fascial restricitons, Decreased activity tolerance, Hypermobility, Pain, Hypomobility, Impaired flexibility, Improper body mechanics,  Decreased strength, Postural dysfunction  Visit  Diagnosis: Acute bilateral low back pain without sciatica  Muscle weakness (generalized)     Problem List Patient Active Problem List   Diagnosis Date Noted  . Rash 01/06/2020  . Spondylolisthesis at L5-S1 level 12/25/2019    [facility] April Ma L Ewing PT, DPT 01/08/2020, 5:00 PM  [facility] [place], [place], [address] Phone: [phone]   Fax:  [phone]  Name: Nicholas Ware MRN: [id number] Date of Birth: 05/13/1996

## 2020-01-10 ENCOUNTER — Other Ambulatory Visit: Payer: Self-pay

## 2020-01-10 ENCOUNTER — Ambulatory Visit (INDEPENDENT_AMBULATORY_CARE_PROVIDER_SITE_OTHER): Payer: 59 | Admitting: Physical Therapy

## 2020-01-10 DIAGNOSIS — M545 Low back pain, unspecified: Secondary | ICD-10-CM

## 2020-01-10 DIAGNOSIS — M6281 Muscle weakness (generalized): Secondary | ICD-10-CM

## 2020-01-10 NOTE — Therapy (Signed)
[place], [place], [address] Phone: [phone]   Fax:  [phone]  Physical Therapy Treatment  Patient Details  Name: Nicholas Ware MRN: [id number] Date of Birth: June 08, 1996 Referring Provider (PT): Silverio Decamp, MD   Encounter Date: 01/10/2020  PT End of Session - 01/10/20 1102    Visit Number  5    Number of Visits  12    Date for PT Re-Evaluation  02/06/20    PT Start Time  1103    PT Stop Time  1145    PT Time Calculation (min)  42 min    Activity Tolerance  Patient tolerated treatment well    Behavior During Therapy  [facility] for tasks assessed/performed       No past medical history on file.  Past Surgical History:  Procedure Laterality Date  . TONSILLECTOMY    . TONSILLECTOMY AND ADENOIDECTOMY    . TYMPANOSTOMY TUBE PLACEMENT  ilst    There were no vitals filed for this visit.  Subjective Assessment - 01/10/20 1106    Subjective  Pt states he had a difficult time tolerating sleeping on his bed that night after previous session and had to sleep on the floor. He reports that last night he was able to sleep fine.    Limitations  Other (comment);Walking    Currently in Pain?  No/denies                        [facility] Adult PT Treatment/Exercise - 01/10/20 0001      Lumbar Exercises: Stretches   Active Hamstring Stretch  Right;Left;30 seconds    Piriformis Stretch  Right;Left;30 seconds      Lumbar Exercises: Aerobic   Stationary Bike  x 6 min      Lumbar Exercises: Standing   Other Standing Lumbar Exercises  Lunges x 10 reps   cueing to stabilize pelvis/core     Lumbar Exercises: Supine   Bridge  10 reps   ball between legs     Lumbar Exercises: Sidelying   Hip Abduction  Both;10 reps      Lumbar Exercises: Quadruped   Single Arm Raise  Right;Left;5 reps    Straight Leg Raise  10 reps    Other Quadruped Lumbar Exercises  TSA contractions x 5 reps       Moist Heat Therapy   Number Minutes Moist Heat  5 Minutes    Moist Heat Location  Lumbar Spine      Electrical Stimulation   Electrical Stimulation Location  bilat lumbar    Limited due to error with machine; only on for 2 minutes   Electrical Stimulation Action  IFC    Electrical Stimulation Parameters  to tolerance    Electrical Stimulation Goals  Pain;Tone             PT Education - 01/10/20 1305    Education Details  Modified HEP. Discussed taking a picture of pt on his bike    Person(s) Educated  Patient    Methods  Explanation;Handout    Comprehension  Verbalized understanding;Verbal cues required;Tactile cues required       PT Short Term Goals - 12/26/19 1228      PT SHORT TERM GOAL #1   Title  Pt will be independent with HEP    Time  3    Period  Weeks    Status  New    Target Date  01/16/20        PT Long Term Goals - 12/26/19 1228      PT LONG TERM GOAL #1   Title  Pt will demonstrate improved thoracolumbar mobility in flexion and extension for daily tasks    Time  6    Period  Weeks    Status  New    Target Date  02/06/20      PT LONG TERM GOAL #2   Title  Pt will have 5/5 bilateral hip strength for improved pain    Baseline  4/5 in abduction and extension; limited due to pain    Time  6    Period  Weeks    Status  New    Target Date  02/06/20      PT LONG TERM GOAL #3   Title  Pt will be able to tolerate riding bike >1 hour without additional back pain    Baseline  Unable    Time  6    Period  Weeks    Status  New    Target Date  02/06/20      PT LONG TERM GOAL #4   Title  Pt will report decrease in back pain to <2/10    Baseline  6/10 at worst    Time  6    Period  Weeks    Status  New    Target Date  02/06/20      PT LONG TERM GOAL #5   Title  Pt will have reduced FOTO score to 25% impairment    Baseline  FOTO is 53%    Time  6    Period  Weeks    Status  New    Target Date  02/06/20            Plan - 01/10/20  1306    Clinical Impression Statement  Treatment focused on progressing pt's core, hip and lumbar strengthening with more functional movements. Pt tolerated treatment well. Reinforced core and pelvic stabilization during his exercises.    Examination-Activity Limitations  Bend;Stairs;Sleep;Stand    Examination-Participation Restrictions  Community Activity;Other    Rehab Potential  Good    PT Frequency  2x / week    PT Duration  6 weeks    PT Treatment/Interventions  Electrical Stimulation;Cryotherapy;Iontophoresis 4mg /ml Dexamethasone;Moist Heat;Ultrasound;Stair training;Functional mobility training;Therapeutic activities;Therapeutic exercise;Neuromuscular re-education;Manual techniques;Patient/family education;ADLs/Self Care Home Management;Passive range of motion;Dry needling;Taping;Spinal Manipulations;Joint Manipulations    PT Next Visit Plan  Continue and progress core, back, and posture strengthening. Consider use of more manual therapy if indicated.    PT Home Exercise Plan  [id number]    Consulted and Agree with Plan of Care  Patient       Patient will benefit from skilled therapeutic intervention in order to improve the following deficits and impairments:  Decreased range of motion, Increased fascial restricitons, Decreased activity tolerance, Hypermobility, Pain, Hypomobility, Impaired flexibility, Improper body mechanics, Decreased strength, Postural dysfunction  Visit Diagnosis: Acute bilateral low back pain without sciatica  Muscle weakness (generalized)     Problem List Patient Active Problem List   Diagnosis Date Noted  . Rash 01/06/2020  . Spondylolisthesis at L5-S1 level 12/25/2019    [facility] April Ma L [place] PT, DPT 01/10/2020, 1:08 PM  [facility] [address] [place], [place], [place] Phone: [phone]   Fax:  [phone]  Name: Jazier Mcglamery MRN: Dolores Hoose Date of Birth: May 10, 1996

## 2020-01-15 ENCOUNTER — Other Ambulatory Visit: Payer: Self-pay

## 2020-01-15 ENCOUNTER — Ambulatory Visit (INDEPENDENT_AMBULATORY_CARE_PROVIDER_SITE_OTHER): Payer: 59 | Admitting: Physical Therapy

## 2020-01-15 DIAGNOSIS — M545 Low back pain, unspecified: Secondary | ICD-10-CM

## 2020-01-15 DIAGNOSIS — M6281 Muscle weakness (generalized): Secondary | ICD-10-CM | POA: Diagnosis not present

## 2020-01-15 LAB — LIPID PANEL W/REFLEX DIRECT LDL
Cholesterol: 166 mg/dL (ref <200)
HDL: 34 mg/dL — ABNORMAL LOW (ref >=40)
LDL Cholesterol (Calc): 104 mg/dL (calc) — ABNORMAL HIGH
Non-HDL Cholesterol (Calc): 132 mg/dL (calc) — ABNORMAL HIGH (ref <130)
Total CHOL/HDL Ratio: 4.9 (calc) (ref <5.0)
Triglycerides: 166 mg/dL — ABNORMAL HIGH (ref <150)

## 2020-01-15 LAB — COMPLETE METABOLIC PANEL WITH GFR
AG Ratio: 2.1 (calc) (ref 1.0–2.5)
ALT: 25 U/L (ref 9–46)
AST: 19 U/L (ref 10–40)
Albumin: 4.4 g/dL (ref 3.6–5.1)
Alkaline phosphatase (APISO): 58 U/L (ref 36–130)
BUN: 15 mg/dL (ref 7–25)
CO2: 26 mmol/L (ref 20–32)
Calcium: 9.5 mg/dL (ref 8.6–10.3)
Chloride: 107 mmol/L (ref 98–110)
Creat: 1.05 mg/dL (ref 0.60–1.35)
GFR, Est African American: 115 mL/min/{1.73_m2} (ref >=60)
GFR, Est Non African American: 100 mL/min/{1.73_m2} (ref >=60)
Globulin: 2.1 g/dL (calc) (ref 1.9–3.7)
Glucose, Bld: 96 mg/dL (ref 65–99)
Potassium: 4.4 mmol/L (ref 3.5–5.3)
Sodium: 141 mmol/L (ref 135–146)
Total Bilirubin: 0.5 mg/dL (ref 0.2–1.2)
Total Protein: 6.5 g/dL (ref 6.1–8.1)

## 2020-01-15 LAB — CBC
HCT: 48.9 % (ref 38.5–50.0)
Hemoglobin: 16.9 g/dL (ref 13.2–17.1)
MCH: 30.3 pg (ref 27.0–33.0)
MCHC: 34.6 g/dL (ref 32.0–36.0)
MCV: 87.6 fL (ref 80.0–100.0)
MPV: 10.3 fL (ref 7.5–12.5)
Platelets: 259 10*3/uL (ref 140–400)
RBC: 5.58 10*6/uL (ref 4.20–5.80)
RDW: 13 % (ref 11.0–15.0)
WBC: 6.1 10*3/uL (ref 3.8–10.8)

## 2020-01-15 NOTE — Therapy (Signed)
[place], [place], [address] Phone: [phone]   Fax:  [phone]  Physical Therapy Treatment  Patient Details  Name: Nicholas Ware MRN: [id number] Date of Birth: 02-11-1996 Referring Provider (PT): Silverio Decamp, MD   Encounter Date: 01/15/2020  PT End of Session - 01/15/20 1147    Visit Number  6    Number of Visits  12    Date for PT Re-Evaluation  02/06/20    PT Start Time  1107    PT Stop Time  1147    PT Time Calculation (min)  40 min    Activity Tolerance  Patient tolerated treatment well    Behavior During Therapy  [facility] for tasks assessed/performed       No past medical history on file.  Past Surgical History:  Procedure Laterality Date  . TONSILLECTOMY    . TONSILLECTOMY AND ADENOIDECTOMY    . TYMPANOSTOMY TUBE PLACEMENT  ilst    There were no vitals filed for this visit.  Subjective Assessment - 01/15/20 1255    Subjective  Pt reports he hasn't tried riding his bike since last week.  He continues to wake with low level back pain; resolves with stretches and time.  Overall pleased with progress so far.    Limitations  Other (comment);Walking    Currently in Pain?  No/denies    Pain Score  0-No pain         [facility] PT Assessment - 01/15/20 0001      Assessment   Medical Diagnosis  M43.17 (ICD-10-CM) - Spondylolisthesis at L5-S1 level    Referring Provider (PT)  Silverio Decamp, MD    Onset Date/Surgical Date  12/21/19    Next MD Visit  02/05/20      AROM   Lumbar Flexion  Limited 80%   fingertips to ankles   Lumbar Extension  WNL    Lumbar - Right Side Bend  WFL   pain at midrange   Lumbar - Left Side Bend  WFL   pain at midrange   Lumbar - Right Rotation  WNL    Lumbar - Left Rotation  WNL       [facility] Adult PT Treatment/Exercise - 01/15/20 0001      Lumbar Exercises: Stretches   Hip Flexor Stretch  Right;Left;2 reps;30 seconds   seated, arm  overhead     Lumbar Exercises: Aerobic   Nustep  L6: 5 min       Lumbar Exercises: Machines for Strengthening   Other Lumbar Machine Exercise  lat pull down with 6 plates, cues for core engagement and scap retraction/depression - 10 reps      Lumbar Exercises: Standing   Forward Lunge Limitations  walking lunge x 20 ft x 2 reps    Wall Slides  5 reps;5 seconds   holding 5# wt in/out from waist   Other Standing Lumbar Exercises  split squat position with 2# wood chop x 5 reps each side - moved to single leg high kneeling at mat with 5#, 10 reps each leg forward.     Other Standing Lumbar Exercises  anti-rotation with green band x 10 step outs      Lumbar Exercises: Sidelying   Other Sidelying Lumbar Exercises  modified side plank x 5 sec x2 reps each side.       Lumbar Exercises: Prone   Other Prone Lumbar Exercises  high plank x 20 sec x 2 reps  PT Short Term Goals - 12/26/19 1228      PT SHORT TERM GOAL #1   Title  Pt will be independent with HEP    Time  3    Period  Weeks    Status  New    Target Date  01/16/20        PT Long Term Goals - 12/26/19 1228      PT LONG TERM GOAL #1   Title  Pt will demonstrate improved thoracolumbar mobility in flexion and extension for daily tasks    Time  6    Period  Weeks    Status  New    Target Date  02/06/20      PT LONG TERM GOAL #2   Title  Pt will have 5/5 bilateral hip strength for improved pain    Baseline  4/5 in abduction and extension; limited due to pain    Time  6    Period  Weeks    Status  New    Target Date  02/06/20      PT LONG TERM GOAL #3   Title  Pt will be able to tolerate riding bike >1 hour without additional back pain    Baseline  Unable    Time  6    Period  Weeks    Status  New    Target Date  02/06/20      PT LONG TERM GOAL #4   Title  Pt will report decrease in back pain to <2/10    Baseline  6/10 at worst    Time  6    Period  Weeks    Status  New    Target Date  02/06/20       PT LONG TERM GOAL #5   Title  Pt will have reduced FOTO score to 25% impairment    Baseline  FOTO is 53%    Time  6    Period  Weeks    Status  New    Target Date  02/06/20            Plan - 01/15/20 1256    Clinical Impression Statement  Pt tolerated new core and LE strengthening without any production of pain.  Pt's overall pain level has reduced, however still present.  Pt will try short bike ride again; encouraged to complete hip flexor  stretch afterwards.  Progressing towards goals.    Examination-Activity Limitations  Bend;Stairs;Sleep;Stand    Examination-Participation Restrictions  Community Activity;Other    Rehab Potential  Good    PT Frequency  2x / week    PT Duration  6 weeks    PT Treatment/Interventions  Electrical Stimulation;Cryotherapy;Iontophoresis 4mg /ml Dexamethasone;Moist Heat;Ultrasound;Stair training;Functional mobility training;Therapeutic activities;Therapeutic exercise;Neuromuscular re-education;Manual techniques;Patient/family education;ADLs/Self Care Home Management;Passive range of motion;Dry needling;Taping;Spinal Manipulations;Joint Manipulations    PT Next Visit Plan  Continue and progress core, back, and posture strengthening. assess goals.    PT Home Exercise Plan  [id number]    Consulted and Agree with Plan of Care  Patient       Patient will benefit from skilled therapeutic intervention in order to improve the following deficits and impairments:  Decreased range of motion, Increased fascial restricitons, Decreased activity tolerance, Hypermobility, Pain, Hypomobility, Impaired flexibility, Improper body mechanics, Decreased strength, Postural dysfunction  Visit Diagnosis: Acute bilateral low back pain without sciatica  Muscle weakness (generalized)     Problem List Patient Active Problem List   Diagnosis Date Noted  . Rash  01/06/2020  . Spondylolisthesis at L5-S1 level 12/25/2019   Mayer Camel, PTA 01/15/20 1:07  PM ' [facility] [place] [address] [place], [place], [address] Phone: [phone]   Fax:  [phone]  Name: Nicholas Ware MRN: [id number] Date of Birth: 11/21/1995

## 2020-01-17 ENCOUNTER — Other Ambulatory Visit: Payer: Self-pay

## 2020-01-17 ENCOUNTER — Encounter: Payer: Self-pay | Admitting: Physical Therapy

## 2020-01-17 ENCOUNTER — Ambulatory Visit (INDEPENDENT_AMBULATORY_CARE_PROVIDER_SITE_OTHER): Payer: 59 | Admitting: Physical Therapy

## 2020-01-17 DIAGNOSIS — M6281 Muscle weakness (generalized): Secondary | ICD-10-CM

## 2020-01-17 DIAGNOSIS — M545 Low back pain, unspecified: Secondary | ICD-10-CM

## 2020-01-17 NOTE — Therapy (Signed)
[place], [place], [address] Phone: [phone]   Fax:  [phone]  Physical Therapy Treatment  Patient Details  Name: Nicholas Ware MRN: [id number] Date of Birth: 1996/01/31 Referring Provider (PT): Silverio Decamp, MD   Encounter Date: 01/17/2020   PT End of Session - 01/17/20 1114    Visit Number 7    Number of Visits 12    Date for PT Re-Evaluation 02/06/20    PT Start Time 1106    PT Stop Time 1145    PT Time Calculation (min) 39 min    Activity Tolerance No increased pain;Patient tolerated treatment well    Behavior During Therapy [facility] for tasks assessed/performed           History reviewed. No pertinent past medical history.  Past Surgical History:  Procedure Laterality Date  . TONSILLECTOMY    . TONSILLECTOMY AND ADENOIDECTOMY    . TYMPANOSTOMY TUBE PLACEMENT  ilst    There were no vitals filed for this visit.   Subjective Assessment - 01/17/20 1114    Subjective Pt did well after last session; no extra soreness.  Continues to wake with discomfort in low back.  He brought his bicycle to have his posture observed when he is on it.    Currently in Pain? No/denies    Pain Score 0-No pain            [facility] PT Assessment - 01/17/20 0001      Assessment   Medical Diagnosis M43.17 (ICD-10-CM) - Spondylolisthesis at L5-S1 level    Referring Provider (PT) Silverio Decamp, MD    Onset Date/Surgical Date 12/21/19    Next MD Visit 02/05/20      Posture/Postural Control   Posture Comments posture on bike riding around parking lot, WNL; no obvious deficits.       Strength   Right/Left Hip --   no pain   Right Hip Extension 5/5    Right Hip ABduction 4+/5    Left Hip Extension 5/5    Left Hip ABduction 5/5           [facility] Adult PT Treatment/Exercise - 01/17/20 0001      Lumbar Exercises: Stretches   Passive Hamstring Stretch Left;Right;2 reps;20 seconds    Hip  Flexor Stretch Right;Left;2 reps;20 seconds   seated, arm overhead   Piriformis Stretch Right;Left;1 rep;20 seconds   seated     Lumbar Exercises: Machines for Strengthening   Other Lumbar Machine Exercise lat pull down with 6 plates, cues for core engagement and scap retraction/depression - 10 reps, 2nd set with 5 plates      Lumbar Exercises: Standing   Forward Lunge Limitations walking lunge x 30 ft x 2 reps, holding 5# in each hand     Side Lunge Limitations walking side lunge holding 10# at core, x 30 ft Rt/ 30 ft  Lt      Lumbar Exercises: Quadruped   Madcat/Old Horse 5 reps    Madcat/Old Horse Limitations and wag the tail, side to side motion x 5    Opposite Arm/Leg Raise Right arm/Left leg;Left arm/Right leg;10 reps;3 seconds   belly on top of red physioball   Plank high plank with knee taps x 10 reps, then 10 shoulder taps     Other Quadruped Lumbar Exercises single knee high kneeling reverse wood chops holding 7# x 10 each side.     Other Quadruped Lumbar Exercises on red pball:  W's x 10 reps, trial of 3 reps W's to/from supermans.   childs pose with wide knees x 20 sec x 2 reps, one rep with lateral trunk flexion                      PT Short Term Goals - 12/26/19 1228      PT SHORT TERM GOAL #1   Title Pt will be independent with HEP    Time 3    Period Weeks    Status New    Target Date 01/16/20             PT Long Term Goals - 01/17/20 1149      PT LONG TERM GOAL #1   Title Pt will demonstrate improved thoracolumbar mobility in flexion and extension for daily tasks    Time 6    Period Weeks    Status Achieved      PT LONG TERM GOAL #2   Title Pt will have 5/5 bilateral hip strength for improved pain    Time 6    Period Weeks    Status Partially Met      PT LONG TERM GOAL #3   Title Pt will be able to tolerate riding bike >1 hour without additional back pain    Baseline able to ride 1 hr without pain; pain began after completion.    Time 6     Period Weeks    Status Partially Met      PT LONG TERM GOAL #4   Title Pt will report decrease in back pain to <2/10    Time 6    Period Weeks    Status Partially Met      PT LONG TERM GOAL #5   Title Pt will have reduced FOTO score to 25% impairment    Baseline FOTO is 53% at eval    Time 6    Period Weeks    Status On-going                 Plan - 01/17/20 1153    Clinical Impression Statement Pt was able to complete exercises for core with increased difficulty without pain, only fatigue. Pt has partially met his goals and is making good gains each visit. Encouraged pt to trial another bike ride this weekend to assess if back pain returns.    Examination-Activity Limitations Bend;Stairs;Sleep;Stand    Examination-Participation Restrictions Community Activity;Other    Rehab Potential Good    PT Frequency 2x / week    PT Duration 6 weeks    PT Treatment/Interventions Electrical Stimulation;Cryotherapy;Iontophoresis 88m/ml Dexamethasone;Moist Heat;Ultrasound;Stair training;Functional mobility training;Therapeutic activities;Therapeutic exercise;Neuromuscular re-education;Manual techniques;Patient/family education;ADLs/Self Care Home Management;Passive range of motion;Dry needling;Taping;Spinal Manipulations;Joint Manipulations    PT Next Visit Plan Continue and progress core, back, and posture strengthening.    PT Home Exercise Plan [id number]    Consulted and Agree with Plan of Care Patient           Patient will benefit from skilled therapeutic intervention in order to improve the following deficits and impairments:  Decreased range of motion, Increased fascial restricitons, Decreased activity tolerance, Hypermobility, Pain, Hypomobility, Impaired flexibility, Improper body mechanics, Decreased strength, Postural dysfunction  Visit Diagnosis: Acute bilateral low back pain without sciatica  Muscle weakness (generalized)     Problem List Patient Active Problem List    Diagnosis Date Noted  . Rash 01/06/2020  . Spondylolisthesis at L5-S1 level 12/25/2019   JKerin Perna PTA 01/17/20  12:19 PM  [facility] [place], [place], [address] Phone: [phone]   Fax:  [phone]  Name: Jessiah Steinhart MRN: [id number] Date of Birth: 1996/02/03

## 2020-01-22 ENCOUNTER — Encounter: Payer: Self-pay | Admitting: Physical Therapy

## 2020-01-22 ENCOUNTER — Other Ambulatory Visit: Payer: Self-pay

## 2020-01-22 ENCOUNTER — Ambulatory Visit (INDEPENDENT_AMBULATORY_CARE_PROVIDER_SITE_OTHER): Payer: 59 | Admitting: Physical Therapy

## 2020-01-22 DIAGNOSIS — M545 Low back pain, unspecified: Secondary | ICD-10-CM

## 2020-01-22 DIAGNOSIS — M6281 Muscle weakness (generalized): Secondary | ICD-10-CM

## 2020-01-22 NOTE — Therapy (Signed)
[place], [place], [address] Phone: [phone]   Fax:  [phone]  Physical Therapy Treatment  Patient Details  Name: Nicholas Ware MRN: [id number] Date of Birth: Jan 14, 1996 Referring Provider (PT): Silverio Decamp, MD   Encounter Date: 01/22/2020   PT End of Session - 01/22/20 1122    Visit Number 8    Number of Visits 12    Date for PT Re-Evaluation 02/06/20    PT Start Time 6384    PT Stop Time 1144    PT Time Calculation (min) 41 min    Activity Tolerance No increased pain;Patient tolerated treatment well    Behavior During Therapy [facility] for tasks assessed/performed           History reviewed. No pertinent past medical history.  Past Surgical History:  Procedure Laterality Date  . TONSILLECTOMY    . TONSILLECTOMY AND ADENOIDECTOMY    . TYMPANOSTOMY TUBE PLACEMENT  ilst    There were no vitals filed for this visit.   Subjective Assessment - 01/22/20 1107    Subjective Pt reports he went camping this weekend; slept in hammock and woke without back pain "it felt amazing".  He is awaiting new mattress.  He continues to wake (at home with current mattress) with 2/10 LBP.    Currently in Pain? No/denies   just stiff   Pain Location Back              [facility] PT Assessment - 01/22/20 0001      Assessment   Medical Diagnosis M43.17 (ICD-10-CM) - Spondylolisthesis at L5-S1 level    Referring Provider (PT) Silverio Decamp, MD    Onset Date/Surgical Date 12/21/19    Next MD Visit 02/05/20           [facility] Adult PT Treatment/Exercise - 01/22/20 0001      Lumbar Exercises: Stretches   Passive Hamstring Stretch Left;Right;2 reps;20 seconds    Hip Flexor Stretch --    Piriformis Stretch Right;Left;20 seconds;2 reps   seated   Other Lumbar Stretch Exercise childs pose with / without lateral flexion x 10 sec each       Lumbar Exercises: Aerobic   Elliptical L2: 1.5 min fwd/  1.5 min backward.       Lumbar Exercises: Machines for Strengthening   Other Lumbar Machine Exercise lat pull down with 7 plates, cues for core engagement and scap retraction/depression - 10 reps, 2 sets (trial of hands facing in/out)      Lumbar Exercises: Standing   Side Lunge Limitations walking side lunge holding 10# at core, x 30 ft Rt/ 30 ft  Lt    Wall Slides 10 reps   ball behind back; 5# in/out from core   Other Standing Lumbar Exercises walking lunges with torso twist holding 5# x 25 ft x 2 reps       Lumbar Exercises: Seated   Other Seated Lumbar Exercises wood chop with blue band x 10 each side.       Lumbar Exercises: Prone   Other Prone Lumbar Exercises goal post to/from superman with axial ext x 10      Lumbar Exercises: Quadruped   Opposite Arm/Leg Raise Right arm/Left leg;Left arm/Right leg;10 reps;3 seconds   belly on top of green physioball   Other Quadruped Lumbar Exercises knee tucks (body in plank with feet on green pball ) x 10 (challenging))    Other Quadruped Lumbar Exercises on green pball:  W's to/from superman x 10 reps                    PT Short Term Goals - 12/26/19 1228      PT SHORT TERM GOAL #1   Title Pt will be independent with HEP    Time 3    Period Weeks    Status New    Target Date 01/16/20             PT Long Term Goals - 01/22/20 1121      PT LONG TERM GOAL #1   Title Pt will demonstrate improved thoracolumbar mobility in flexion and extension for daily tasks    Time 6    Period Weeks    Status Achieved      PT LONG TERM GOAL #2   Title Pt will have 5/5 bilateral hip strength for improved pain    Time 6    Period Weeks    Status Achieved      PT LONG TERM GOAL #3   Title Pt will be able to tolerate riding bike >1 hour without additional back pain    Baseline able to ride 1 hr without pain; pain began after completion.01/17/20    Time 6    Period Weeks    Status Partially Met      PT LONG TERM GOAL #4   Title  Pt will report decrease in back pain to <2/10    Time 6    Period Weeks    Status Partially Met      PT LONG TERM GOAL #5   Title Pt will have reduced FOTO score to 25% impairment    Baseline FOTO is 53% at eval    Time 6    Period Weeks    Status On-going                 Plan - 01/22/20 1306    Clinical Impression Statement Pt continues to progress with core / lumbar strengthenign without production of LBP.  Pt to trial bike ride before upcoming appts to assess progress towards this goal.  Anticipate d/c after next few visits.    Examination-Activity Limitations Bend;Stairs;Sleep;Stand    Examination-Participation Restrictions Community Activity;Other    Rehab Potential Good    PT Frequency 2x / week    PT Duration 6 weeks    PT Treatment/Interventions Electrical Stimulation;Cryotherapy;Iontophoresis 66m/ml Dexamethasone;Moist Heat;Ultrasound;Stair training;Functional mobility training;Therapeutic activities;Therapeutic exercise;Neuromuscular re-education;Manual techniques;Patient/family education;ADLs/Self Care Home Management;Passive range of motion;Dry needling;Taping;Spinal Manipulations;Joint Manipulations    PT Next Visit Plan Continue and progress core, back, and posture strengthening. Manual therapy to low back    PT Home Exercise Plan [id number]    Consulted and Agree with Plan of Care Patient           Patient will benefit from skilled therapeutic intervention in order to improve the following deficits and impairments:  Decreased range of motion, Increased fascial restricitons, Decreased activity tolerance, Hypermobility, Pain, Hypomobility, Impaired flexibility, Improper body mechanics, Decreased strength, Postural dysfunction  Visit Diagnosis: Acute bilateral low back pain without sciatica  Muscle weakness (generalized)     Problem List Patient Active Problem List   Diagnosis Date Noted  . Rash 01/06/2020  . Spondylolisthesis at L5-S1 level 12/25/2019     CShelbie Hutching6/16/2021, 1:08 PM  [facility] [address]Phone: [phone]  Fax:  [phone] Name: Nicholas FriedlMRN:  [id number] Date of Birth: [birth year]

## 2020-01-24 ENCOUNTER — Other Ambulatory Visit: Payer: Self-pay

## 2020-01-24 ENCOUNTER — Ambulatory Visit (INDEPENDENT_AMBULATORY_CARE_PROVIDER_SITE_OTHER): Payer: 59 | Admitting: Physical Therapy

## 2020-01-24 DIAGNOSIS — M6281 Muscle weakness (generalized): Secondary | ICD-10-CM

## 2020-01-24 DIAGNOSIS — M545 Low back pain, unspecified: Secondary | ICD-10-CM

## 2020-01-24 NOTE — Therapy (Signed)
[place], [place], [address] Phone: [phone]   Fax:  [phone]  Physical Therapy Treatment  Patient Details  Name: Nicholas Ware MRN: [id number] Date of Birth: 1995/10/12 Referring Provider (PT): Silverio Decamp, MD   Encounter Date: 01/24/2020   PT End of Session - 01/24/20 1325    Visit Number 9    Number of Visits 12    Date for PT Re-Evaluation 02/06/20    PT Start Time 0258    PT Stop Time 1558    PT Time Calculation (min) 42 min    Activity Tolerance No increased pain;Patient tolerated treatment well    Behavior During Therapy [facility] for tasks assessed/performed           No past medical history on file.  Past Surgical History:  Procedure Laterality Date  . TONSILLECTOMY    . TONSILLECTOMY AND ADENOIDECTOMY    . TYMPANOSTOMY TUBE PLACEMENT  ilst    There were no vitals filed for this visit.   Subjective Assessment - 01/24/20 1400    Subjective Pt reports his new mattress will arrive on Wednesday.  continues to awake with 2/10 pain with current mattress; resolves after stretches in morning.  No pain after last session.    Currently in Pain? No/denies    Pain Score 0-No pain              [facility] PT Assessment - 01/24/20 0001      Assessment   Medical Diagnosis M43.17 (ICD-10-CM) - Spondylolisthesis at L5-S1 level    Referring Provider (PT) Silverio Decamp, MD    Onset Date/Surgical Date 12/21/19    Next MD Visit 02/05/20                         [place] Adult PT Treatment/Exercise - 01/24/20 0001      Lumbar Exercises: Machines for Strengthening   Other Lumbar Machine Exercise lat pull down with 6 plates with eccentric lowering       Lumbar Exercises: Standing   Shoulder Extension Strengthening;Both;10 reps   3 sec hold in ext, 2 sets   Theraband Level (Shoulder Extension) Level 4 (Blue)    Other Standing Lumbar Exercises walking lunges with  torso twist holding 10# x 30 ft; walking lunge holding 10# in each hand.     Other Standing Lumbar Exercises wood chop holding 10# x 10 reps each; side lunges holding 10# x 30 ft Rt/Lt.       Lumbar Exercises: Supine   Other Supine Lumbar Exercises pilates hundred beginner, 60 pulses; intermediate 25 pulses.     Other Supine Lumbar Exercises pilates bicycle x 10 reps      Lumbar Exercises: Prone   Opposite Arm/Leg Raise Right arm/Left leg;Left arm/Right leg;10 reps    Other Prone Lumbar Exercises goal post to/from superman with axial ext x 5      Lumbar Exercises: Quadruped   Madcat/Old Horse 5 reps    Single Arm Raise Right;Left;5 reps    Other Quadruped Lumbar Exercises childs pose with wide knees x 60 sec x 2 reps     Other Quadruped Lumbar Exercises feet on ball and hands on chair:  knee tucks x 3, attempt at single leg bicycle motion with leg on ball - too difficult, stopped.       Manual Therapy   Manual Therapy Soft tissue mobilization;Taping    Manual therapy comments I strips of  reg Rock tape applied to bilat lumbar paraspinals to increase proprioception, decompress tissue.      Soft tissue mobilization IASTM to bilat lumbar paraspinals to decrease fascial restrictions and improve mobility.                    PT Education - 01/24/20 1402    Education Details added exercises to HEP.    Person(s) Educated Patient    Methods Explanation;Handout;Demonstration;Verbal cues    Comprehension Verbalized understanding;Returned demonstration            PT Short Term Goals - 12/26/19 1228      PT SHORT TERM GOAL #1   Title Pt will be independent with HEP    Time 3    Period Weeks    Status New    Target Date 01/16/20             PT Long Term Goals - 01/22/20 1121      PT LONG TERM GOAL #1   Title Pt will demonstrate improved thoracolumbar mobility in flexion and extension for daily tasks    Time 6    Period Weeks    Status Achieved      PT LONG TERM GOAL  #2   Title Pt will have 5/5 bilateral hip strength for improved pain    Time 6    Period Weeks    Status Achieved      PT LONG TERM GOAL #3   Title Pt will be able to tolerate riding bike >1 hour without additional back pain    Baseline able to ride 1 hr without pain; pain began after completion.01/17/20    Time 6    Period Weeks    Status Partially Met      PT LONG TERM GOAL #4   Title Pt will report decrease in back pain to <2/10    Time 6    Period Weeks    Status Partially Met      PT LONG TERM GOAL #5   Title Pt will have reduced FOTO score to 25% impairment    Baseline FOTO is 53% at eval    Time 6    Period Weeks    Status On-going                 Plan - 01/24/20 1358    Clinical Impression Statement Pt tolerated advanced core strengthening without any production of pain.  Palpable tightness (and reported tenderness) in lumbar paraspinals with IASTM (L>R).  Trial of reg Rock tape applied at beginning of session, however once pt began sweating with exercise, the tape rolled off.  Pt making great progress towards remaining goals.    Examination-Activity Limitations Bend;Stairs;Sleep;Stand    Examination-Participation Restrictions Community Activity;Other    Rehab Potential Good    PT Frequency 2x / week    PT Duration 6 weeks    PT Treatment/Interventions Electrical Stimulation;Cryotherapy;Iontophoresis 4mg/ml Dexamethasone;Moist Heat;Ultrasound;Stair training;Functional mobility training;Therapeutic activities;Therapeutic exercise;Neuromuscular re-education;Manual techniques;Patient/family education;ADLs/Self Care Home Management;Passive range of motion;Dry needling;Taping;Spinal Manipulations;Joint Manipulations    PT Next Visit Plan Continue and progress core, back, and posture strengthening. Manual therapy to low back    PT Home Exercise Plan [email or web address]    Consulted and Agree with Plan of Care Patient           Patient will benefit from skilled therapeutic  intervention in order to improve the following deficits and impairments:  Decreased range of motion, Increased fascial restricitons, Decreased activity tolerance,   Hypermobility, Pain, Hypomobility, Impaired flexibility, Improper body mechanics, Decreased strength, Postural dysfunction  Visit Diagnosis: Acute bilateral low back pain without sciatica  Muscle weakness (generalized)     Problem List Patient Active Problem List   Diagnosis Date Noted  . Rash 01/06/2020  . Spondylolisthesis at L5-S1 level 12/25/2019    Carlson-Long, Jennifer L 01/24/2020, 2:03 PM  [facility] [address] [place], , [address] Phone: [phone]   Fax:  [phone]  Name: Adden Denny MRN: [id number] Date of Birth: 02/27/1996   

## 2020-01-24 NOTE — Patient Instructions (Addendum)
Kinesiology tape What is kinesiology tape?  There are many brands of kinesiology tape.  KTape, Rock [employer], [employer], Dynamic tape, to name a few. It is an elasticized tape designed to support the body's natural healing process. This tape provides stability and support to muscles and joints without restricting motion. It can also help decrease swelling in the area of application. How does it work? The tape microscopically lifts and decompresses the skin to allow for drainage of lymph (swelling) to flow away from area, reducing inflammation.  The tape has the ability to help re-educate the neuromuscular system by targeting specific receptors in the skin.  The presence of the tape increases the body's awareness of posture and body mechanics.  Do not use with: . Open wounds . Skin lesions . Adhesive allergies Safe removal of the tape: In some rare cases, mild/moderate skin irritation can occur.  This can include redness, itchiness, or hives. If this occurs, immediately remove tape and consult your primary care physician if symptoms are severe or do not resolve within 2 days.  To remove tape safely, hold nearby skin with one hand and gentle roll tape down with other hand.  You can apply oil or conditioner to tape while in shower prior to removal to loosen adhesive.  DO NOT swiftly rip tape off like a band-aid, as this could cause skin tears and additional skin irritation.    Access Code: [email or web address]: [email or web address]: 06/18/2021Prepared by: [facility] [place]  Seated Hamstring Stretch - 1 x daily - 7 x weekly - 1 sets - 3 reps - 30 sec hold  Seated Piriformis Stretch - 1 x daily - 7 x weekly - 1 sets - 3 reps - 30 sec hold  Prone Alternating Arm and Leg Lifts - 1 x daily - 7 x weekly - 1 sets - 10 reps  Wall Squat - 1 x daily - 7 x weekly - 3 sets - 10 reps  Half Kneeling Diagonal Chops with Medicine Ball - 1 x daily - 3 x weekly - 2 sets - 10 reps   Standing Anti-Rotation Press with Anchored Resistance - 1 x daily - 3 x weekly - 2 sets - 10 reps  Side Plank on Knees - 1 x daily - 3 x weekly - 1 sets - 2-3 reps - 5 hold  Full Plank - 1 x daily - 3 x weekly - 1 sets - 2-3 reps - 20 hold  Shoulder Extension with Resistance - 1 x daily - 7 x weekly - 3 sets - 10 reps - 3-5 hold  Half Kneeling Hip Flexor Stretch with Tri-Planar Reach - 2 x daily - 7 x weekly - 1 sets - 2 reps - 20-30 hold  The Hundred 2 Beginner - 1 x daily - 3 x weekly - 1 sets  The Hundred 3 Intermediate - Table Top - 1 x daily - 3 x weekly - 1 sets

## 2020-01-29 ENCOUNTER — Ambulatory Visit (INDEPENDENT_AMBULATORY_CARE_PROVIDER_SITE_OTHER): Payer: 59 | Admitting: Physical Therapy

## 2020-01-29 ENCOUNTER — Encounter: Payer: Self-pay | Admitting: Physical Therapy

## 2020-01-29 ENCOUNTER — Other Ambulatory Visit: Payer: Self-pay

## 2020-01-29 DIAGNOSIS — M545 Low back pain, unspecified: Secondary | ICD-10-CM

## 2020-01-29 DIAGNOSIS — M6281 Muscle weakness (generalized): Secondary | ICD-10-CM

## 2020-01-29 NOTE — Therapy (Signed)
[place], [place], [address] Phone: [phone]   Fax:  [phone]  Physical Therapy Treatment  Patient Details  Name: Nicholas Ware MRN: [id number] Date of Birth: 12/25/1995 Referring Provider (PT): Silverio Decamp, MD   Encounter Date: 01/29/2020   PT End of Session - 01/29/20 1117    Visit Number 10    Number of Visits 12    Date for PT Re-Evaluation 02/06/20    PT Start Time 1102    PT Stop Time 1141    PT Time Calculation (min) 39 min    Activity Tolerance No increased pain;Patient tolerated treatment well    Behavior During Therapy [facility] for tasks assessed/performed           History reviewed. No pertinent past medical history.  Past Surgical History:  Procedure Laterality Date  . TONSILLECTOMY    . TONSILLECTOMY AND ADENOIDECTOMY    . TYMPANOSTOMY TUBE PLACEMENT  ilst    There were no vitals filed for this visit.   Subjective Assessment - 01/29/20 1119    Subjective Pt reports conitinued 2/10 pain in low back upon waking; pain doesn't resolve until he stretches.  He brought exercises and handouts to get clarification on what to perform.  His new mattress just arrived this morning.    Currently in Pain? No/denies    Pain Score 0-No pain              [facility] PT Assessment - 01/29/20 0001      Assessment   Medical Diagnosis M43.17 (ICD-10-CM) - Spondylolisthesis at L5-S1 level    Referring Provider (PT) Silverio Decamp, MD    Onset Date/Surgical Date 12/21/19    Next MD Visit 02/05/20            [place] Adult PT Treatment/Exercise - 01/29/20 0001      Lumbar Exercises: Stretches   Passive Hamstring Stretch Left;Right;2 reps;20 seconds    Lower Trunk Rotation 2 reps;10 seconds   arms in Y   Piriformis Stretch Right;Left;20 seconds;2 reps   seated   Other Lumbar Stretch Exercise modified childs pose with hands on table rolling it forward, then with lateral trunk  flexion       Lumbar Exercises: Aerobic   Recumbent Bike L5: 2 min for warm up.       Lumbar Exercises: Machines for Strengthening   Other Lumbar Machine Exercise lat pull down with 6 plates with eccentric lowering x 10 reps      Lumbar Exercises: Standing   Shoulder Extension Strengthening;Both;10 reps;Theraband    Theraband Level (Shoulder Extension) Level 4 (Blue)   3 sec hold in ext   Shoulder Extension Limitations cues to unlock knees, engage core, and retract scapula      Lumbar Exercises: Supine   Bridge 10 reps;5 seconds   feet on green pball, arms crossed against chest   Other Supine Lumbar Exercises bridge with hamstring curl, with legs on green pball x 5 reps      Lumbar Exercises: Sidelying   Other Sidelying Lumbar Exercises modified side plank x 10 sec x2 reps each side.       Lumbar Exercises: Quadruped   Opposite Arm/Leg Raise Right arm/Left leg;Left arm/Right leg;10 reps;3 seconds   belly on top of green physioball   Opposite Arm/Leg Raise Limitations pt reports Rt arm/Lt leg is more difficult.     Plank high plank with knee taps x 10 reps, then 10 shoulder taps  Other Quadruped Lumbar Exercises mountain climbers x 20, 2 sets    Other Quadruped Lumbar Exercises on green pball: W's to/from superman x 10 reps                  PT Education - 01/29/20 1118    Education Details consolidated HEP, clarified freq of exercises.    Person(s) Educated Patient    Methods Explanation    Comprehension Verbalized understanding            PT Short Term Goals - 12/26/19 1228      PT SHORT TERM GOAL #1   Title Pt will be independent with HEP    Time 3    Period Weeks    Status New    Target Date 01/16/20             PT Long Term Goals - 01/22/20 1121      PT LONG TERM GOAL #1   Title Pt will demonstrate improved thoracolumbar mobility in flexion and extension for daily tasks    Time 6    Period Weeks    Status Achieved      PT LONG TERM GOAL #2    Title Pt will have 5/5 bilateral hip strength for improved pain    Time 6    Period Weeks    Status Achieved      PT LONG TERM GOAL #3   Title Pt will be able to tolerate riding bike >1 hour without additional back pain    Baseline able to ride 1 hr without pain; pain began after completion.01/17/20    Time 6    Period Weeks    Status Partially Met      PT LONG TERM GOAL #4   Title Pt will report decrease in back pain to <2/10    Time 6    Period Weeks    Status Partially Met      PT LONG TERM GOAL #5   Title Pt will have reduced FOTO score to 25% impairment    Baseline FOTO is 53% at eval    Time 6    Period Weeks    Status On-going                 Plan - 01/29/20 1148    Clinical Impression Statement Pt reported increased difficulty with bird dog exercise with Lt leg/Rt arm (quadruped on green physioball).  Pt able to tolerate advanced core strengthening without production of pain in LB. Time spent consolidating HEP to give clarification to freq of each exercise and eliminating exercises that no longer challenge him.  Progressing well towards remaining goals.  Anticipate d/c or hold after next visit.    Examination-Activity Limitations Bend;Stairs;Sleep;Stand    Examination-Participation Restrictions Community Activity;Other    Rehab Potential Good    PT Frequency 2x / week    PT Duration 6 weeks    PT Treatment/Interventions Electrical Stimulation;Cryotherapy;Iontophoresis '4mg'$ /ml Dexamethasone;Moist Heat;Ultrasound;Stair training;Functional mobility training;Therapeutic activities;Therapeutic exercise;Neuromuscular re-education;Manual techniques;Patient/family education;ADLs/Self Care Home Management;Passive range of motion;Dry needling;Taping;Spinal Manipulations;Joint Manipulations    PT Next Visit Plan FOTO. assess goals. finalize HEP.    PT Home Exercise Plan [email or web address]    Consulted and Agree with Plan of Care Patient           Patient will benefit from skilled  therapeutic intervention in order to improve the following deficits and impairments:  Decreased range of motion, Increased fascial restricitons, Decreased activity tolerance, Hypermobility, Pain, Hypomobility, Impaired flexibility,  Improper body mechanics, Decreased strength, Postural dysfunction  Visit Diagnosis: Acute bilateral low back pain without sciatica  Muscle weakness (generalized)     Problem List Patient Active Problem List   Diagnosis Date Noted  . Rash 01/06/2020  . Spondylolisthesis at L5-S1 level 12/25/2019   Kerin Perna, PTA 01/29/20 11:52 AM  [place], [place], [address] Phone: [phone]   Fax:  [phone]  Name: Nicholas Ware MRN: [id number] Date of Birth: 1995-10-22

## 2020-01-31 ENCOUNTER — Encounter: Payer: Self-pay | Admitting: Physical Therapy

## 2020-01-31 ENCOUNTER — Ambulatory Visit (INDEPENDENT_AMBULATORY_CARE_PROVIDER_SITE_OTHER): Payer: 59 | Admitting: Physical Therapy

## 2020-01-31 ENCOUNTER — Other Ambulatory Visit: Payer: Self-pay

## 2020-01-31 DIAGNOSIS — M6281 Muscle weakness (generalized): Secondary | ICD-10-CM | POA: Diagnosis not present

## 2020-01-31 DIAGNOSIS — M545 Low back pain, unspecified: Secondary | ICD-10-CM

## 2020-01-31 NOTE — Patient Instructions (Signed)
Access Code: [email or web address]: [email or web address]: 06/25/2021Prepared by: [facility] [place]  Seated Hamstring Stretch - 1 x daily - 7 x weekly - 1 sets - 3 reps - 30 sec hold  Seated Piriformis Stretch - 1 x daily - 7 x weekly - 1 sets - 3 reps - 30 sec hold  Prone Alternating Arm and Leg Lifts - 1 x daily - 7 x weekly - 1 sets - 10 reps  Wall Squat - 1 x daily - 7 x weekly - 3 sets - 10 reps  Half Kneeling Diagonal Chops with Medicine Ball - 1 x daily - 3 x weekly - 2 sets - 10 reps  Standing Anti-Rotation Press with Anchored Resistance - 1 x daily - 3 x weekly - 2 sets - 10 reps  Side Plank on Knees - 1 x daily - 3 x weekly - 1 sets - 2-3 reps - 5 hold  Full Plank - 1 x daily - 3 x weekly - 1 sets - 2-3 reps - 20 hold  Shoulder Extension with Resistance - 1 x daily - 7 x weekly - 3 sets - 10 reps - 3-5 hold  Half Kneeling Hip Flexor Stretch with Tri-Planar Reach - 2 x daily - 7 x weekly - 1 sets - 2 reps - 20-30 hold  The Hundred 2 Beginner - 1 x daily - 3 x weekly - 1 sets  The Hundred 3 Intermediate - Table Top - 1 x daily - 3 x weekly - 1 sets  Sidelying Thoracic Rotation with Open Book - 1 x daily - 7 x weekly - 1 sets - 10 reps  Triangle - 1 x daily - 2 x weekly - 1 sets - 2 reps - 15 hold

## 2020-01-31 NOTE — Therapy (Addendum)
[place], [place], [address] Phone: [phone]   Fax:  [phone]  Physical Therapy Treatment and Discharge  Patient Details  Name: Nicholas Ware MRN: [id number] Date of Birth: 11-21-1995 Referring Provider (PT): Silverio Decamp, MD   Encounter Date: 01/31/2020   PT End of Session - 01/31/20 1133    Visit Number 11    Number of Visits 12    Date for PT Re-Evaluation 02/06/20    PT Start Time 1102    PT Stop Time 1146   ice last 10 min   PT Time Calculation (min) 44 min    Activity Tolerance No increased pain;Patient tolerated treatment well    Behavior During Therapy [facility] for tasks assessed/performed           History reviewed. No pertinent past medical history.  Past Surgical History:  Procedure Laterality Date  . TONSILLECTOMY    . TONSILLECTOMY AND ADENOIDECTOMY    . TYMPANOSTOMY TUBE PLACEMENT  ilst    There were no vitals filed for this visit.   Subjective Assessment - 01/31/20 1105    Subjective Pt reports he loves his new mattress, "I practically hop out of bed now".   He did an hour bike ride and had pain of 1-2/10 in back; resolved with stretches and time (45 min).    Currently in Pain? No/denies    Pain Score 0-No pain              [facility] PT Assessment - 01/31/20 0001      Assessment   Medical Diagnosis M43.17 (ICD-10-CM) - Spondylolisthesis at L5-S1 level    Referring Provider (PT) Silverio Decamp, MD    Onset Date/Surgical Date 12/21/19    Next MD Visit 02/05/20      Observation/Other Assessments   Focus on Therapeutic Outcomes (FOTO)  17% limitation            [facility] Adult PT Treatment/Exercise - 01/31/20 0001      Pilates   Other Pilates beginner 100 x 1 rep (50 pulses), intermediate 100 (50 pulses) x 1      Lumbar Exercises: Stretches   Lower Trunk Rotation 2 reps;20 seconds   arms in T, head in opp direction   Other Lumbar Stretch Exercise  modified downward dog x 2 reps of 15-20 sec;  modified triangle pose x 2 reps each side (hand on chair); Thread the needle x 2 reps each side (challenging due to tight shirt);      Other Lumbar Stretch Exercise open book x 3 reps without resistance, 5 with green band each side.       Lumbar Exercises: Supine   Other Supine Lumbar Exercises hooklying physioball pass arms to overhead to feet touching mat x 5 reps (core engaged)      Lumbar Exercises: Quadruped   Madcat/Old Horse 5 reps    Madcat/Old Horse Limitations and wag the tail, side to side x 5      Modalities   Modalities Cryotherapy      Cryotherapy   Number Minutes Cryotherapy 10 Minutes    Cryotherapy Location Lumbar Spine    Type of Cryotherapy Ice pack                  PT Education - 01/31/20 1157    Education Details HEP - added 2 yoga stretches    Person(s) Educated Patient    Methods Explanation;Handout;Verbal cues;Tactile cues;Demonstration    Comprehension  Verbalized understanding;Returned demonstration            PT Short Term Goals - 12/26/19 1228      PT SHORT TERM GOAL #1   Title Pt will be independent with HEP    Time 3    Period Weeks    Status Achieved    Target Date 01/16/20             PT Long Term Goals - 01/31/20 1144      PT LONG TERM GOAL #1   Title Pt will demonstrate improved thoracolumbar mobility in flexion and extension for daily tasks    Time 6    Period Weeks    Status Achieved      PT LONG TERM GOAL #2   Title Pt will have 5/5 bilateral hip strength for improved pain    Time 6    Period Weeks    Status Achieved      PT LONG TERM GOAL #3   Title Pt will be able to tolerate riding bike >1 hour without additional back pain    Baseline able to ride 1 hr without pain; 2/10 pain began upon completion. 01/31/20    Time 6    Period Weeks    Status Partially Met      PT LONG TERM GOAL #4   Title Pt will report decrease in back pain to <2/10    Time 6    Period  Weeks    Status Achieved      PT LONG TERM GOAL #5   Title Pt will have reduced FOTO score to 25% impairment    Time 6    Period Weeks    Status Achieved                 Plan - 01/31/20 1912    Clinical Impression Statement Pt has reported some relief of back pain in AMs with arrival of new mattress.  Pt has established HEP program with core strengthening and a few stretches that are going well.  Trialed some modified yoga poses for additional stretch to LB and hips; not all tolerated due to stiffness and attire worn to clinic. Pt has met his FOTO goal.  He is doing well at this point, only having some minor pain after completing 1 hr bike rike; resolved with stretching and rest.  Pt agreeable to holding therapy for 30 days while he works on [employer].    Examination-Activity Limitations Bend;Stairs;Sleep;Stand    Examination-Participation Restrictions Community Activity;Other    Rehab Potential Good    PT Frequency 2x / week    PT Duration 6 weeks    PT Treatment/Interventions Electrical Stimulation;Cryotherapy;Iontophoresis 10m/ml Dexamethasone;Moist Heat;Ultrasound;Stair training;Functional mobility training;Therapeutic activities;Therapeutic exercise;Neuromuscular re-education;Manual techniques;Patient/family education;ADLs/Self Care Home Management;Passive range of motion;Dry needling;Taping;Spinal Manipulations;Joint Manipulations    PT Next Visit Plan hold until 02/29/20; if pt doesn't return will d/c.    PT Home Exercise Plan [email or web address]    Consulted and Agree with Plan of Care Patient           PHYSICAL THERAPY DISCHARGE SUMMARY  Visits from Start of Care: 11  Current functional level related to goals / functional outcomes: See above. Pt has met all goals except LTG #3 which he has partialy met   Remaining deficits: See above.   Education / Equipment: Pt has been provided with advanced HEP and education on back health/posture.   Plan: Patient agrees to discharge.   Patient goals were met. Patient is  being discharged due to meeting the stated rehab goals.  ?????        Patient will benefit from skilled therapeutic intervention in order to improve the following deficits and impairments:  Decreased range of motion, Increased fascial restricitons, Decreased activity tolerance, Hypermobility, Pain, Hypomobility, Impaired flexibility, Improper body mechanics, Decreased strength, Postural dysfunction  Visit Diagnosis: Acute bilateral low back pain without sciatica  Muscle weakness (generalized)     Problem List Patient Active Problem List   Diagnosis Date Noted  . Rash 01/06/2020  . Spondylolisthesis at L5-S1 level 12/25/2019    [facility] April Gordy Levan, [place], DPT 03/23/2020 8:50 AM  Kerin Perna, PTA 01/31/20 7:17 PM  [facility] [place], [place], [address] Phone: [phone]   Fax:  [phone]  Name: Alim Cattell MRN: [id number] Date of Birth: 1995-12-25

## 2020-02-05 ENCOUNTER — Encounter: Payer: Self-pay | Admitting: Sports Medicine

## 2020-02-05 ENCOUNTER — Ambulatory Visit (INDEPENDENT_AMBULATORY_CARE_PROVIDER_SITE_OTHER): Payer: 59 | Admitting: Sports Medicine

## 2020-02-05 ENCOUNTER — Other Ambulatory Visit: Payer: Self-pay

## 2020-02-05 DIAGNOSIS — M4317 Spondylolisthesis, lumbosacral region: Secondary | ICD-10-CM | POA: Diagnosis not present

## 2020-02-05 NOTE — Progress Notes (Signed)
    Procedures performed today:    None.  Independent interpretation of notes and tests performed by another provider:   None.  Brief History, Exam, Impression, and Recommendations:    Spondylolisthesis at L5-S1 level Nicholas Ware returns, he has 5 mm of spondylolisthesis at L5-S1, he responded extremely well to aggressive physical therapy, continues to home rehab exercises, he is almost pain-free, and continues to improve. He will continue his home exercise program indefinitely, and return to see me as needed. If symptoms worsen we could certainly try another burst of prednisone, or even proceed with MRI for interventional planning. Surgery is of course last resort, and would certainly be in the form of a L5-S1 fusion. Return to see me as needed.    ___________________________________________ Ihor Austin. Benjamin Stain, M.D., ABFM., CAQSM. Primary Care and Sports Medicine  [facility]  Adjunct Instructor of Family Medicine  [employer]

## 2020-02-05 NOTE — Assessment & Plan Note (Signed)
Nicholas Ware returns, he has 5 mm of spondylolisthesis at L5-S1, he responded extremely well to aggressive physical therapy, continues to home rehab exercises, he is almost pain-free, and continues to improve. He will continue his home exercise program indefinitely, and return to see me as needed. If symptoms worsen we could certainly try another burst of prednisone, or even proceed with MRI for interventional planning. Surgery is of course last resort, and would certainly be in the form of a L5-S1 fusion. Return to see me as needed.

## 2020-09-24 ENCOUNTER — Encounter: Payer: Self-pay | Admitting: Family Medicine

## 2020-09-24 ENCOUNTER — Telehealth (INDEPENDENT_AMBULATORY_CARE_PROVIDER_SITE_OTHER): Payer: 59 | Admitting: Family Medicine

## 2020-09-24 DIAGNOSIS — U071 COVID-19: Secondary | ICD-10-CM | POA: Diagnosis not present

## 2020-09-24 NOTE — Progress Notes (Signed)
Nicholas Ware - 25 y.o. male MRN [id number]  Date of birth: [birth year]   This visit type was conducted due to national recommendations for restrictions regarding the COVID-19 Pandemic (e.g. social distancing).  This format is felt to be most appropriate for this patient at this time.  All issues noted in this document were discussed and addressed.  No physical exam was performed (except for noted visual exam findings with Video Visits).  I discussed the limitations of evaluation and management by telemedicine and the availability of in person appointments. The patient expressed understanding and agreed to proceed.  I connected with@ on 09/24/20 at 11:30 AM EST by a video enabled telemedicine application and verified that I am speaking with the correct person using two identifiers.  Present at visit: Everrett Coombe, DO Dolores Hoose   Patient Location: Home [address] [place] [address]   Provider location:   [facility]  Chief Complaint  Patient presents with  . Covid Positive    HPI  Nicholas Ware is a 25 y.o. male who presents via audio/video conferencing for a telehealth visit today.  He tested positive for COVID on 09/22/20 with home test.  Symptom onset on 09/20/20.  Symptoms include fever, body aches, headache, fatigue, cough and sore throat.  He had an e-visit with previous doctor in [employer] yesterday and molnupiravir sent in.  He is taking this and feels like symptoms are improved today.  He is drinking plenty of fluids.  Taking dayquil/nyquil as needed for symptoms as well.    ROS:  A comprehensive ROS was completed and negative except as noted per HPI  No past medical history on file.  Past Surgical History:  Procedure Laterality Date  . TONSILLECTOMY    . TONSILLECTOMY AND ADENOIDECTOMY    . TYMPANOSTOMY TUBE PLACEMENT  ilst    Family History  Problem Relation Age of Onset  . Hypertension Mother   . Diabetes Father     Social History   Socioeconomic History   . Marital status: Single    Spouse name: Not on file  . Number of children: Not on file  . Years of education: Not on file  . Highest education level: Not on file  Occupational History  . Not on file  Tobacco Use  . Smoking status: Never Smoker  . Smokeless tobacco: Never Used  Vaping Use  . Vaping Use: Never used  Substance and Sexual Activity  . Alcohol use: Never  . Drug use: Never  . Sexual activity: Not on file  Other Topics Concern  . Not on file  Social History Narrative  . Not on file   Social Determinants of Health   Financial Resource Strain: Not on file  Food Insecurity: Not on file  Transportation Needs: Not on file  Physical Activity: Not on file  Stress: Not on file  Social Connections: Not on file  Intimate Partner Violence: Not on file     Current Outpatient Medications:  Marland Kitchen  Molnupiravir 200 MG CAPS, Take 200 mg by mouth 2 times daily at 12 noon and 4 pm. Take 4 caps every 12 hours, Disp: , Rfl:   EXAM:  VITALS per patient if applicable: Pulse 82   Wt 240 lb (108.9 kg)   BMI 37.59 kg/m   GENERAL: alert, oriented, appears well and in no acute distress  HEENT: atraumatic, conjunttiva clear, no obvious abnormalities on inspection of external nose and ears  NECK: normal movements of the head and neck  LUNGS:  on inspection no signs of respiratory distress, breathing rate appears normal, no obvious gross SOB, gasping or wheezing  CV: no obvious cyanosis  MS: moves all visible extremities without noticeable abnormality  PSYCH/NEURO: pleasant and cooperative, no obvious depression or anxiety, speech and thought processing grossly intact  ASSESSMENT AND PLAN:  Discussed the following assessment and plan:  COVID-19 Symptoms conistient with COVID and positive home test Improved today, possibly related to starting molnupiriavir.   Recommend continued supportive care.  Complete course of molnupiravir.  Contact clinic if symptom worsen.        I discussed the assessment and treatment plan with the patient. The patient was provided an opportunity to ask questions and all were answered. The patient agreed with the plan and demonstrated an understanding of the instructions.   The patient was advised to call back or seek an in-person evaluation if the symptoms worsen or if the condition fails to improve as anticipated.    Everrett Coombe, DO

## 2020-09-24 NOTE — Assessment & Plan Note (Signed)
Symptoms conistient with COVID and positive home test Improved today, possibly related to starting molnupiriavir.   Recommend continued supportive care.  Complete course of molnupiravir.  Contact clinic if symptom worsen.

## 2020-09-24 NOTE — Progress Notes (Signed)
Symptoms started: 2/13 @ noon. COVID dx: 2/15 @ 930 at home COVID +  Current symptoms: Sore throat Congestion Muscle soreness Mild HA  Meds: Molnupiravir  Thinks he's on the mend.

## 2021-01-14 ENCOUNTER — Emergency Department (INDEPENDENT_AMBULATORY_CARE_PROVIDER_SITE_OTHER)
Admission: EM | Admit: 2021-01-14 | Discharge: 2021-01-14 | Disposition: A | Discharge status: Home / Self Care | Payer: PRIVATE HEALTH INSURANCE | Source: Home / Self Care

## 2021-01-14 ENCOUNTER — Encounter: Payer: Self-pay | Admitting: Emergency Medicine

## 2021-01-14 ENCOUNTER — Encounter: Payer: Self-pay | Admitting: Family Medicine

## 2021-01-14 ENCOUNTER — Other Ambulatory Visit: Payer: Self-pay

## 2021-01-14 DIAGNOSIS — M7022 Olecranon bursitis, left elbow: Secondary | ICD-10-CM

## 2021-01-14 MED ORDER — PREDNISONE 20 MG PO TABS
ORAL_TABLET | ORAL | 0 refills | Status: AC
Start: 2021-01-14 — End: ?

## 2021-01-14 NOTE — ED Provider Notes (Signed)
Nicholas Ware [facility]    CSN: [id number] Arrival date & time: 01/14/21  1730      History   Chief Complaint Chief Complaint  Patient presents with   Elbow Pain    HPI Nicholas Ware is a 25 y.o. male.   HPI 25 year old male presents with left elbow pain for 1 week.  Reports hot to touch and has used over-the-counter ibuprofen with little to no relief.  Patient denies injury or accident to left elbow.  History reviewed. No pertinent past medical history.  Patient Active Problem List   Diagnosis Date Noted   COVID-19 09/24/2020   Rash 01/06/2020   Spondylolisthesis at L5-S1 level 12/25/2019    Past Surgical History:  Procedure Laterality Date   TONSILLECTOMY     TONSILLECTOMY AND ADENOIDECTOMY     TYMPANOSTOMY TUBE PLACEMENT  ilst       Home Medications    Prior to Admission medications   Medication Sig Start Date End Date Taking? Authorizing Provider  predniSONE (DELTASONE) 20 MG tablet Take 3 tabs PO daily x 5 days. 01/14/21  Yes Trevor Iha, FNP  Molnupiravir 200 MG CAPS Take 200 mg by mouth 2 times daily at 12 noon and 4 pm. Take 4 caps every 12 hours    [provider]    Family History Family History  Problem Relation Age of Onset   Hypertension Mother    Diabetes Father     Social History Social History   Tobacco Use   Smoking status: Never   Smokeless tobacco: Never  Vaping Use   Vaping Use: Never used  Substance Use Topics   Alcohol use: Never   Drug use: Never     Allergies   Cefprozil, Cephalosporins, Loracarbef, and Penicillins   Review of Systems Review of Systems  Constitutional: Negative.   HENT: Negative.    Eyes: Negative.   Respiratory: Negative.    Cardiovascular: Negative.   Gastrointestinal: Negative.   Genitourinary: Negative.   Musculoskeletal:        Left elbow pain and swelling x1 week  Skin: Negative.   Neurological: Negative.     Physical Exam Triage Vital Signs ED Triage Vitals  Enc Vitals  Group     BP 01/14/21 1757 123/78     Pulse Rate 01/14/21 1757 (!) 52     Resp --      Temp 01/14/21 1757 98.7 F (37.1 C)     Temp Source 01/14/21 1757 Oral     SpO2 01/14/21 1757 97 %     Weight --      Height --      Head Circumference --      Peak Flow --      Pain Score 01/14/21 1758 2     Pain Loc --      Pain Edu? --      Excl. in GC? --    No data found.  Updated Vital Signs BP 123/78 (BP Location: Right Arm)   Pulse (!) 52   Temp 98.7 F (37.1 C) (Oral)   SpO2 97%     Physical Exam Vitals and nursing note reviewed.  Constitutional:      General: He is not in acute distress.    Appearance: Normal appearance. He is normal weight. He is not ill-appearing.  HENT:     Head: Normocephalic and atraumatic.     Mouth/Throat:     Mouth: Mucous membranes are moist.     Pharynx: Oropharynx is clear.  Eyes:     Extraocular Movements: Extraocular movements intact.     Conjunctiva/sclera: Conjunctivae normal.     Pupils: Pupils are equal, round, and reactive to light.  Cardiovascular:     Rate and Rhythm: Normal rate and regular rhythm.     Pulses: Normal pulses.     Heart sounds: Normal heart sounds.  Pulmonary:     Effort: Pulmonary effort is normal.     Breath sounds: Normal breath sounds.     Comments: No adventitious breath sounds noted Musculoskeletal:     Cervical back: Normal range of motion and neck supple.     Comments: Left elbow (lateral/medial aspect of olecranon): TTP, erythematous, mild soft tissue swelling noted, no pain elicited with flexion/extension, pronation/supination  Skin:    Comments: Left elbow (medial/lateral olecranon): Erythematous with mild soft tissue swelling  Neurological:     General: No focal deficit present.     Mental Status: He is alert and oriented to person, place, and time.  Psychiatric:        Mood and Affect: Mood normal.        Behavior: Behavior normal.     UC Treatments / Results  Labs (all labs ordered are  listed, but only abnormal results are displayed) Labs Reviewed - No data to display  EKG   Radiology No results found.  Procedures Procedures (including critical care time)  Medications Ordered in UC Medications - No data to display  Initial Impression / Assessment and Plan / UC Course  I have reviewed the triage vital signs and the nursing notes.  Pertinent labs & imaging results that were available during my care of the patient were reviewed by me and considered in my medical decision making (see chart for details).    MDM: 1. Olecranon bursitis left elbow.  Discharged home, hemodynamically stable. Final Clinical Impressions(s) / UC Diagnoses   Final diagnoses:  Olecranon bursitis, left elbow     Discharge Instructions      Advised instructed patient to start oral prednisone burst tomorrow, Friday, 01/15/2021.  Advised patient to take medication with food as directed to completion.  Encourage patient to increase daily water intake.  Advised patient to avoid strenuous, repetitive, overuse movements involving left elbow for the next 7 to 10 days.  Patient may RICE left elbow as well.     ED Prescriptions     Medication Sig Dispense Auth. Provider   predniSONE (DELTASONE) 20 MG tablet Take 3 tabs PO daily x 5 days. 15 tablet Trevor Iha, FNP      PDMP not reviewed this encounter.   Trevor Iha, FNP 01/14/21 1850

## 2021-01-14 NOTE — Discharge Instructions (Addendum)
Advised instructed patient to start oral prednisone burst tomorrow, Friday, 01/15/2021.  Advised patient to take medication with food as directed to completion.  Encourage patient to increase daily water intake.  Advised patient to avoid strenuous, repetitive, overuse movements involving left elbow for the next 7 to 10 days.  Patient may RICE left elbow as well.

## 2021-01-14 NOTE — ED Triage Notes (Signed)
Patient c/o left elbow pain x 1 week.  No apparent injury to elbow.  Patient noticed the swelling to the elbow yesterday, hot to the touch.  Patient has taken Advil today.

## 2022-03-29 IMAGING — DX DG LUMBAR SPINE COMPLETE 4+V
5 series · 5 of 5 positions shown · non-contrast
Comparison: None.

CLINICAL DATA: Low back pain. Pain after bike riding.

EXAM:
LUMBAR SPINE - COMPLETE 4+ VIEW

[l-spine ap]
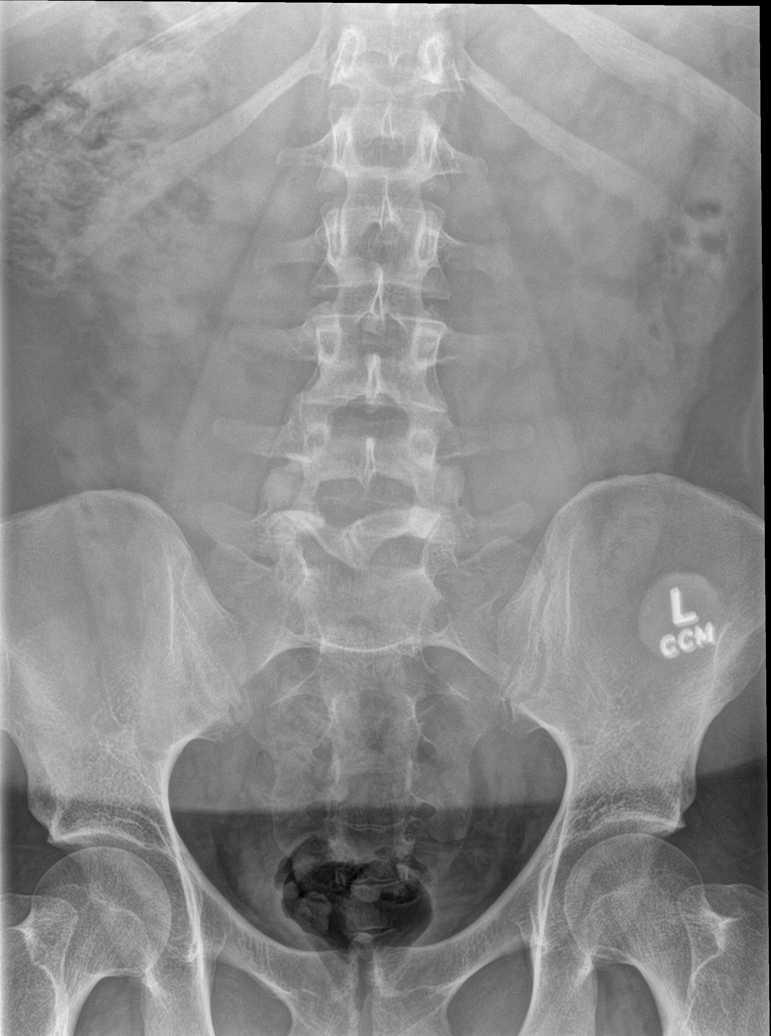

[l-spine obl (1 of 2)]
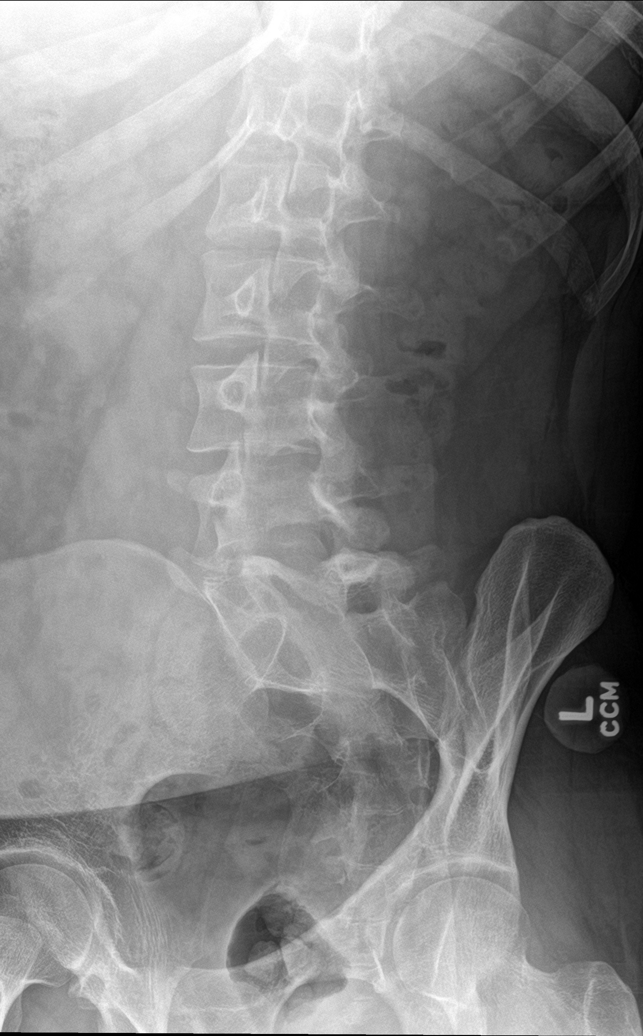

[l-spine obl (2 of 2)]
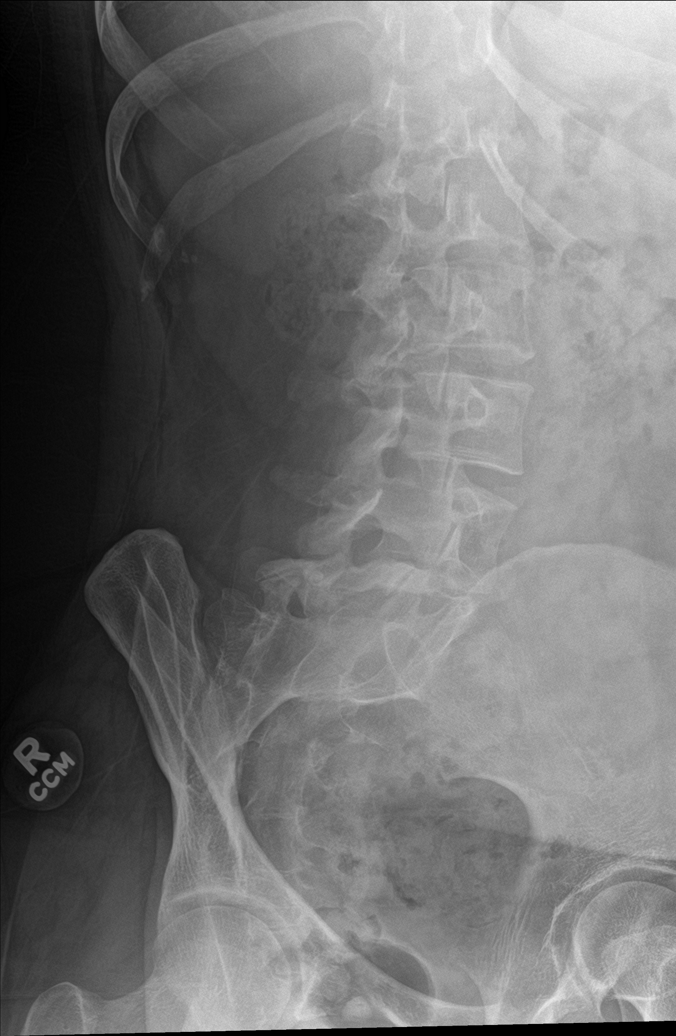

[l-spine lat]
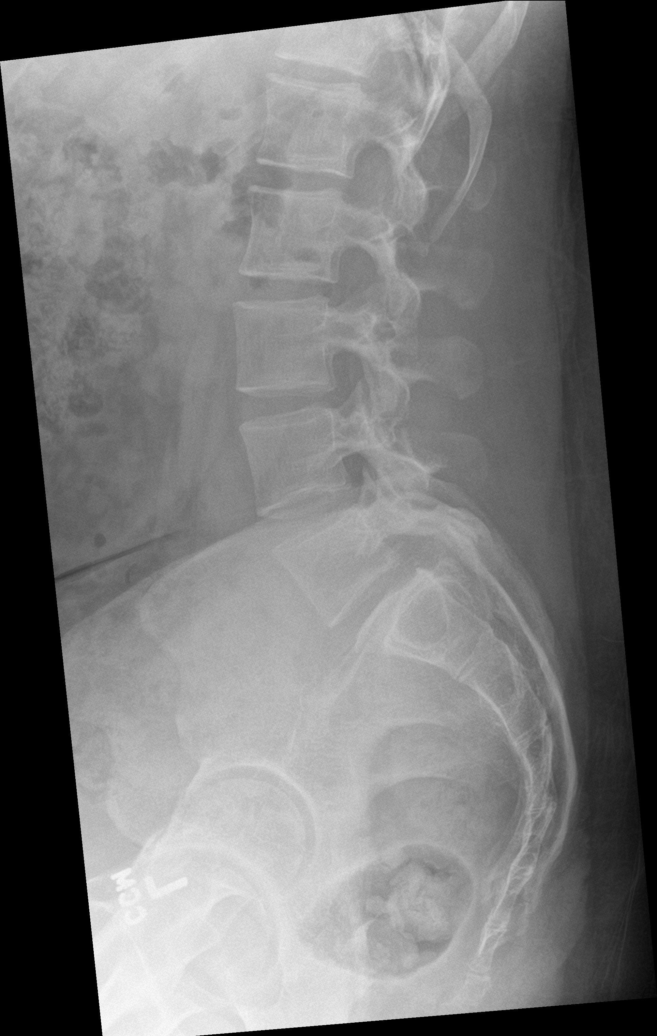

[l-spine spot]
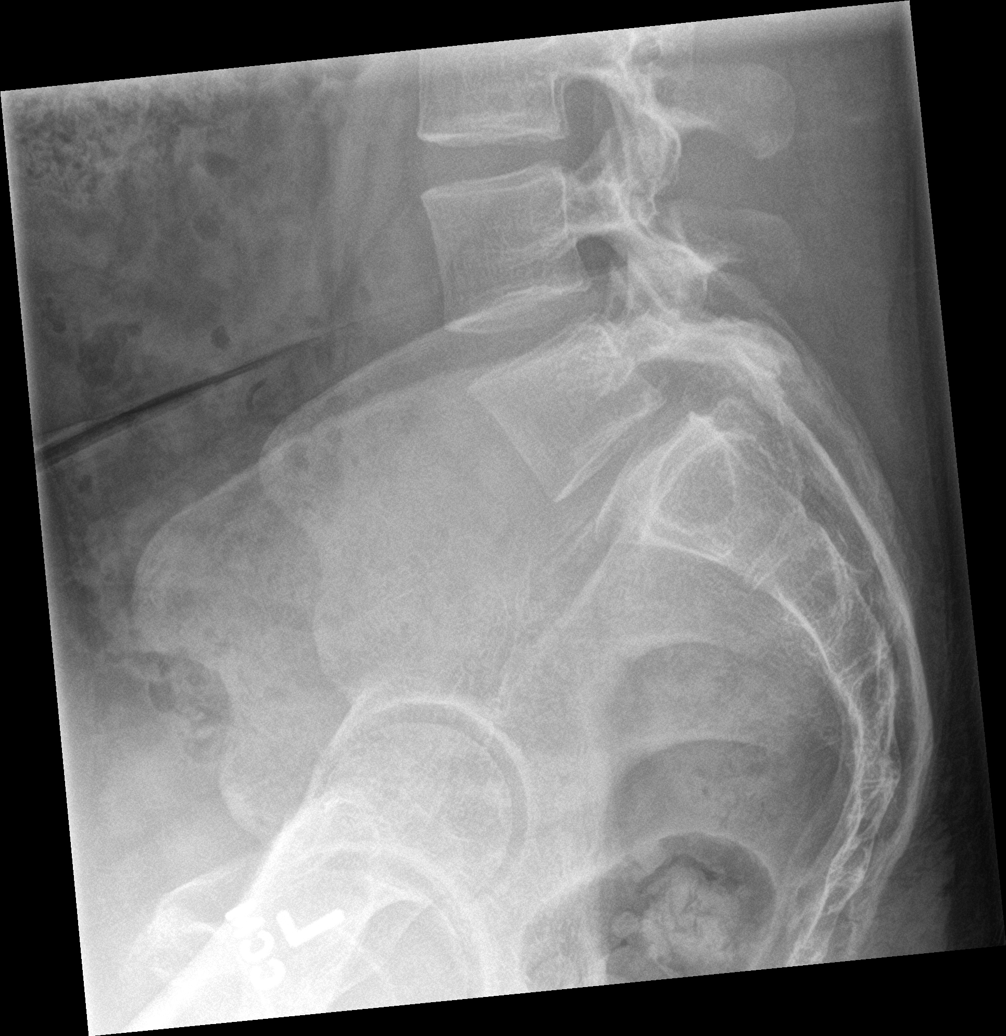

[5 of 5 positions shown; findings below may reference images not displayed]

FINDINGS: No sign of fracture. 5 mm anterolisthesis of L5 on S1 w with some
degenerative changes in this area. Vertebral body heights and disc
spaces are otherwise well maintained.
IMPRESSION: 5 mm anterolisthesis of L5 on S1 with some associated degenerative
changes.
